# Patient Record
Sex: Female | Born: 1970 | Race: Black or African American | Hispanic: No | Marital: Married | Smoking: Never smoker
Health system: Southern US, Community
[De-identification: ages and names within clinical notes are randomized; demographics above are authoritative.]

## PROBLEM LIST (undated history)

## (undated) DIAGNOSIS — E785 Hyperlipidemia, unspecified: Secondary | ICD-10-CM

## (undated) DIAGNOSIS — I10 Essential (primary) hypertension: Secondary | ICD-10-CM

## (undated) DIAGNOSIS — E119 Type 2 diabetes mellitus without complications: Secondary | ICD-10-CM

## (undated) HISTORY — DX: Essential (primary) hypertension: I10

## (undated) HISTORY — DX: Type 2 diabetes mellitus without complications: E11.9

## (undated) HISTORY — DX: Hyperlipidemia, unspecified: E78.5

---

## 2006-07-15 DIAGNOSIS — E119 Type 2 diabetes mellitus without complications: Secondary | ICD-10-CM | POA: Insufficient documentation

## 2006-07-22 DIAGNOSIS — K2100 Gastro-esophageal reflux disease with esophagitis, without bleeding: Secondary | ICD-10-CM | POA: Insufficient documentation

## 2008-02-16 ENCOUNTER — Ambulatory Visit: Payer: Self-pay | Admitting: Family Medicine

## 2009-01-12 IMAGING — CT CT ABD-PELV W/O CM
1 of 2 series · 15 of 32 positions shown, 19 images · non-contrast
Comparison: none

REASON FOR EXAM: LLQ abdominal pain
                CALL REPORT
COMMENTS:   Patient is allergic to shellfish, throat swelling

[Series 2: abdomen · axial · 0.60mm/px · z∈[-565,-160]mm · 15 of 89 slices shown, 19 images]
[im 4/89  soft-tissue]
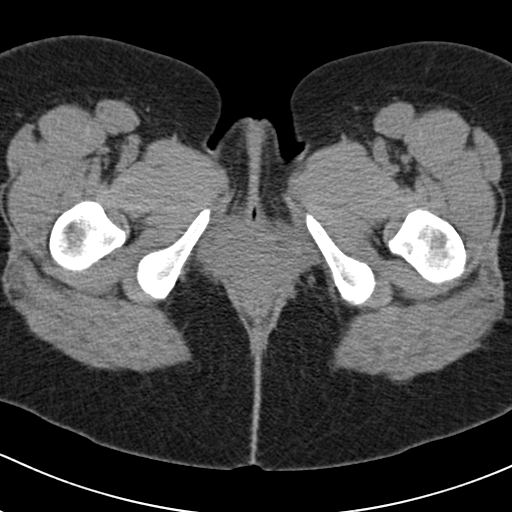
[im 4/89  bone]
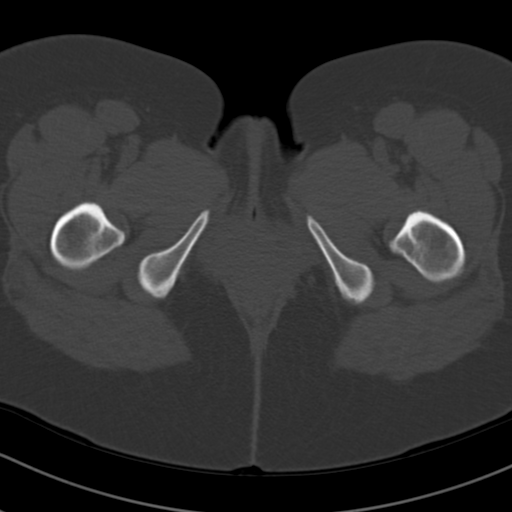
[im 12/89  soft-tissue]
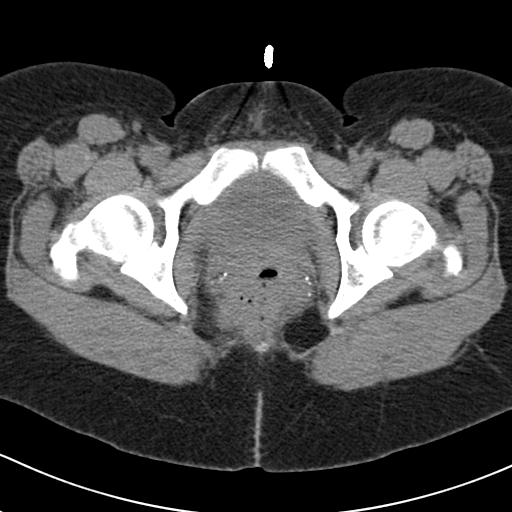
[im 19/89  soft-tissue]
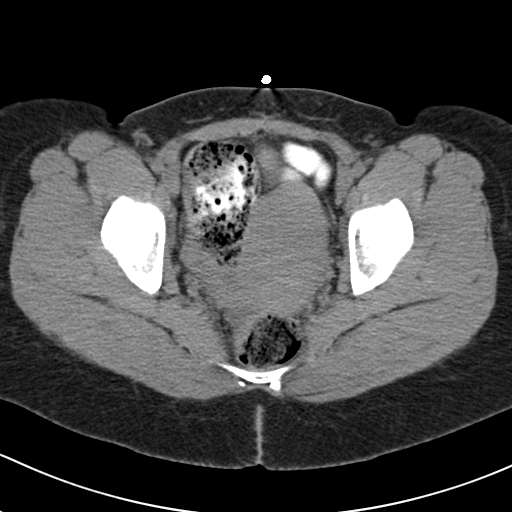
[im 26/89  soft-tissue]
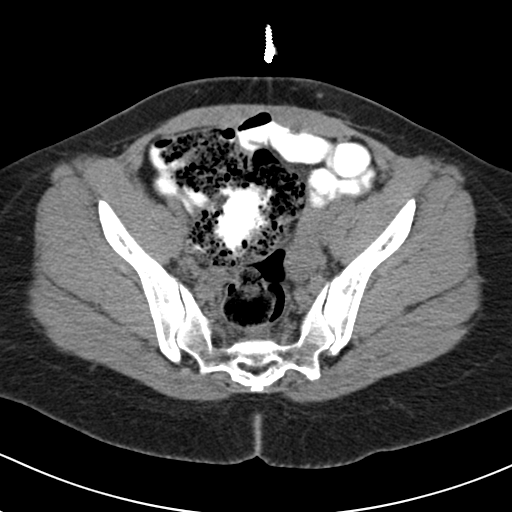
[im 30/89  soft-tissue]
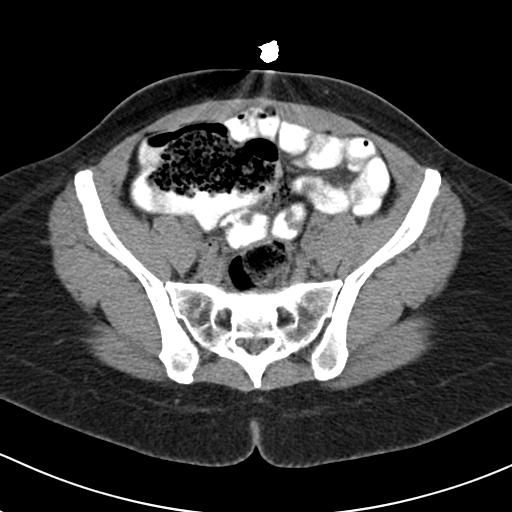
[im 37/89  soft-tissue]
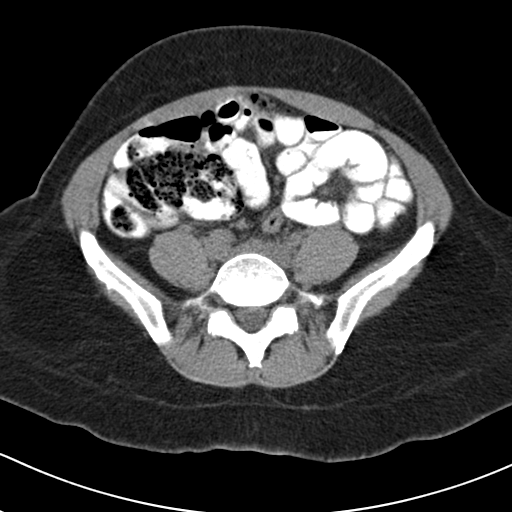
[im 45/89  soft-tissue]
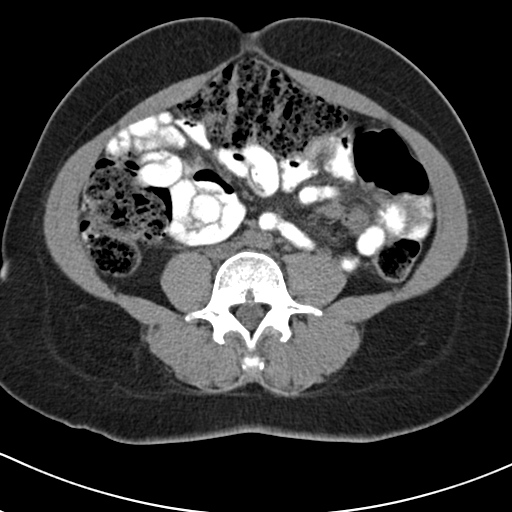
[im 52/89  soft-tissue]
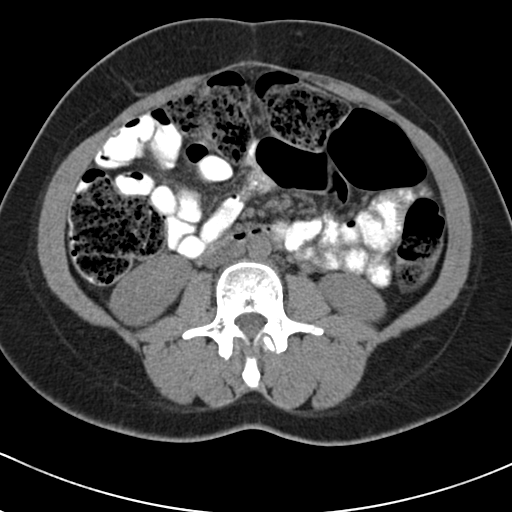
[im 59/89  soft-tissue]
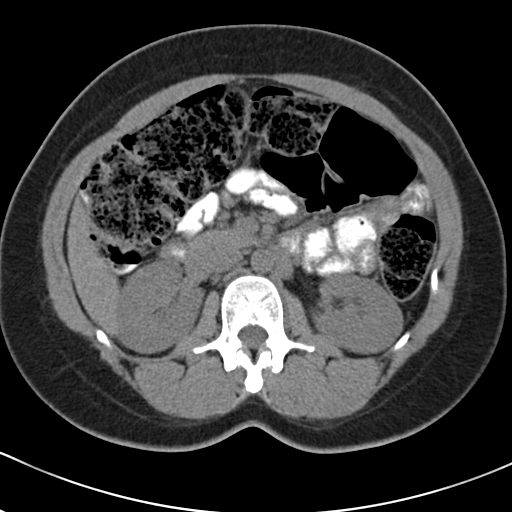
[im 59/89  bone]
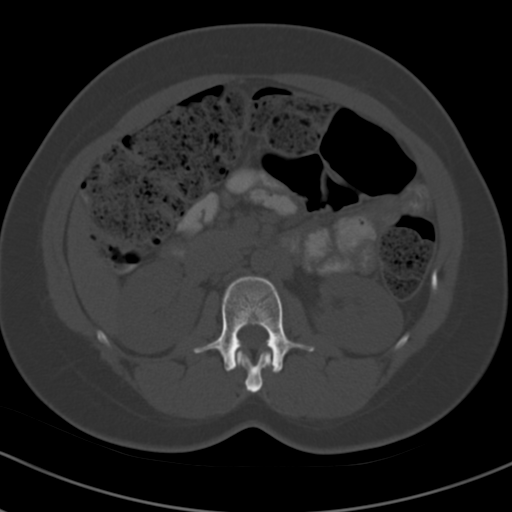
[im 63/89  soft-tissue]
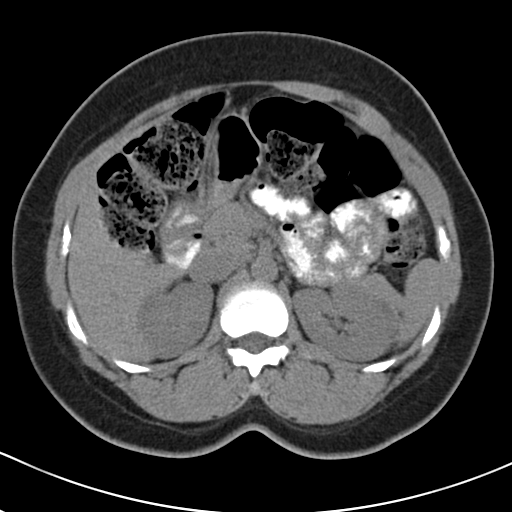
[im 70/89  soft-tissue]
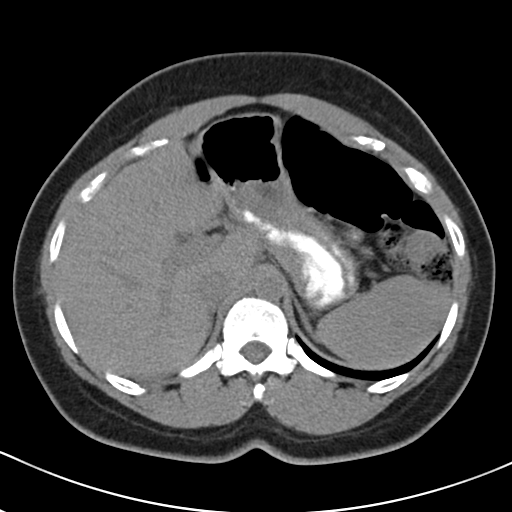
[im 74/89  lung]
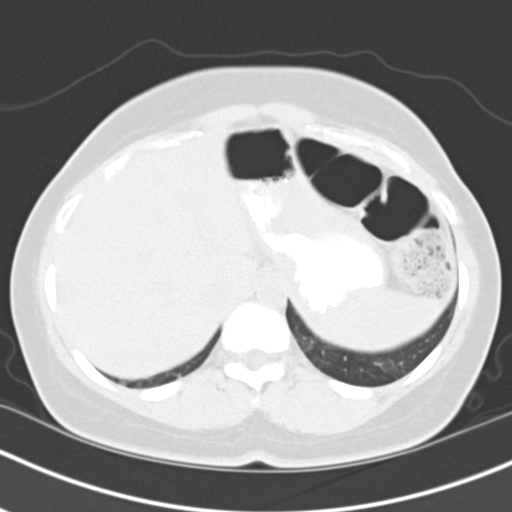
[im 78/89  soft-tissue]
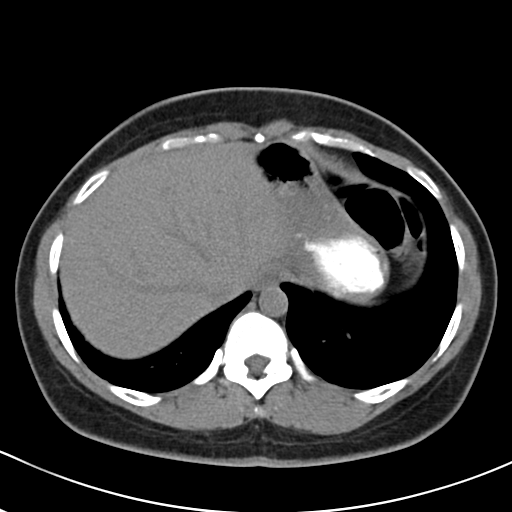
[im 78/89  lung]
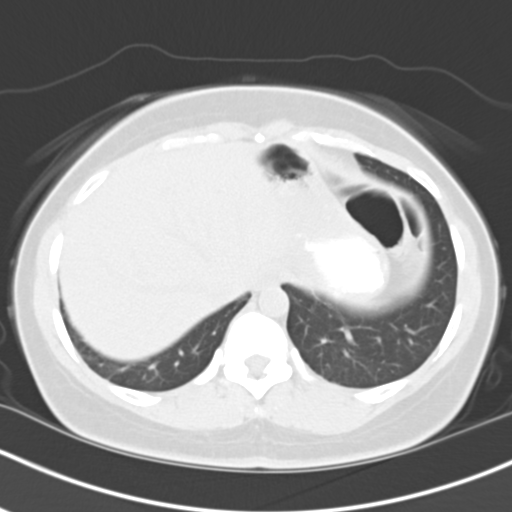
[im 81/89  lung]
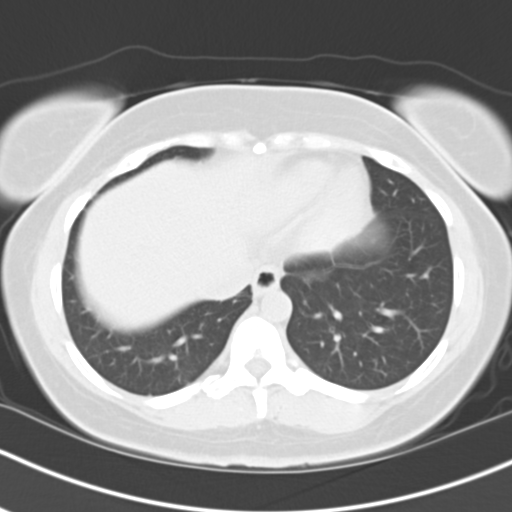
[im 85/89  soft-tissue]
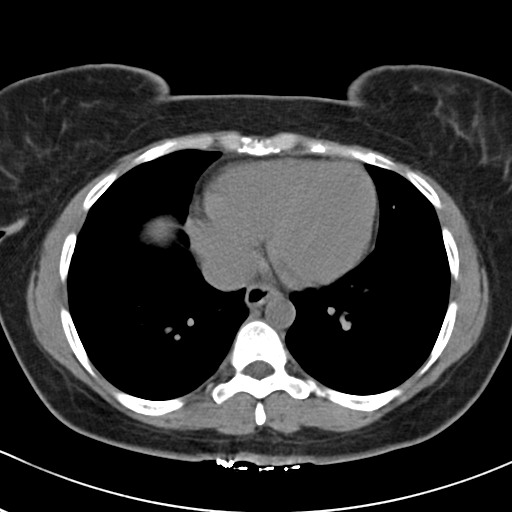
[im 85/89  lung]
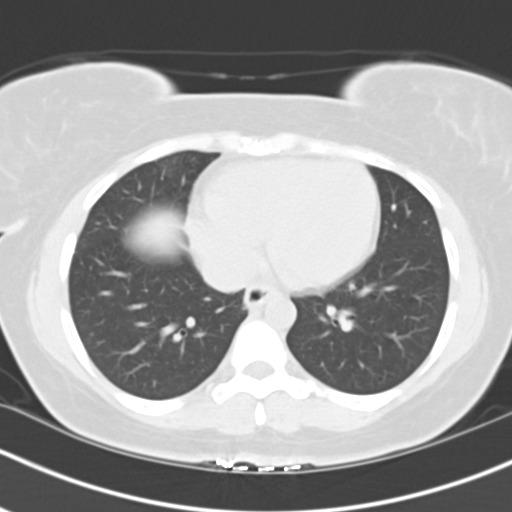

[15 of 32 positions shown; findings below may reference images not displayed]

PROCEDURE:     CT  - CT ABDOMEN AND PELVIS W[DATE]  [DATE]

RESULT:     Helical, non-IV contrast, 5.0 mm sections were obtained from the
lung bases through the pubic symphysis. The patient did receive oral
contrast.

Evaluation of the lung bases demonstrates no gross abnormalities.

Evaluation of the liver, spleen, adrenals, pancreas and kidneys are
unremarkable within the limitations of a noncontrast CT.

There is no CT evidence of bowel obstruction, free fluid, drainable
loculated fluid collections or gross evidence of masses. There does appear
to be small, subcentimeter lymph nodes within the mesentery as well as along
the periaortic region.
IMPRESSION: 1.     No evidence of bowel obstruction, masses, free fluid or drainable
loculated fluid collections. Small lymph nodes are identified within the
mesentery and along the periaortic region. Mesenteric adenitis cannot be
completely excluded.
2.     Moderate to large amount of stool within the ascending and transverse
colon.
3.     There is no evidence of pelvic free fluid or drainable loculated
fluid collections or masses. There does not appear to be CT evidence
reflecting the sequelae of diverticulitis, colitis or enteritis.

## 2010-12-15 ENCOUNTER — Ambulatory Visit: Payer: Self-pay | Admitting: Family Medicine

## 2010-12-26 ENCOUNTER — Ambulatory Visit: Payer: Self-pay | Admitting: Family Medicine

## 2014-04-12 ENCOUNTER — Ambulatory Visit: Payer: Self-pay | Admitting: Family Medicine

## 2014-06-03 LAB — HM PAP SMEAR: HM Pap smear: NEGATIVE

## 2014-07-14 LAB — HM HIV SCREENING LAB: HM HIV SCREENING: NEGATIVE

## 2014-08-12 DIAGNOSIS — O09529 Supervision of elderly multigravida, unspecified trimester: Secondary | ICD-10-CM | POA: Insufficient documentation

## 2014-08-12 DIAGNOSIS — Z8759 Personal history of other complications of pregnancy, childbirth and the puerperium: Secondary | ICD-10-CM | POA: Insufficient documentation

## 2015-03-09 IMAGING — US US PELV - US TRANSVAGINAL
1 series · 14 of 25 positions shown · non-contrast
Comparison: None

CLINICAL DATA: Dysfunctional uterine bleeding, LLQ discomfort



[Series 1: us pelv - us transvaginal · 0.28mm/px · 14 of 100 slices shown]
[im 1/100]
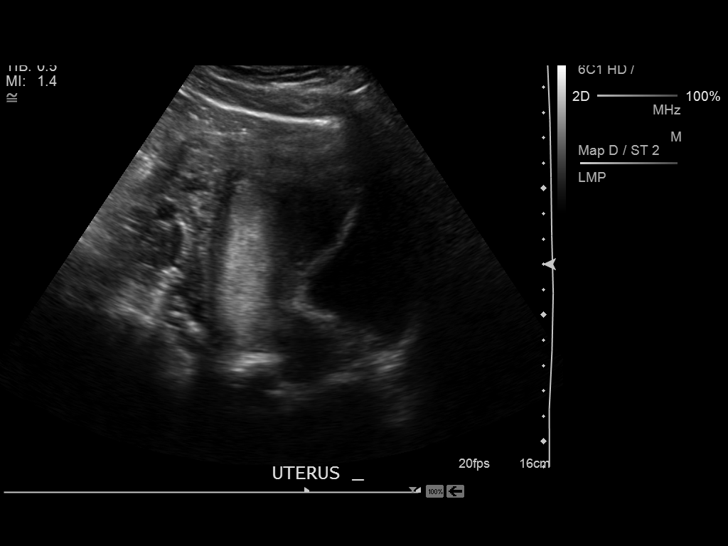
[im 9/100]
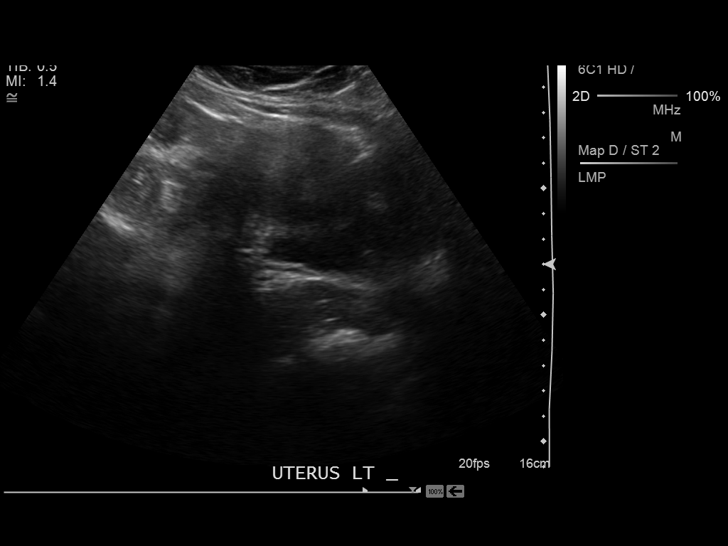
[im 17/100]
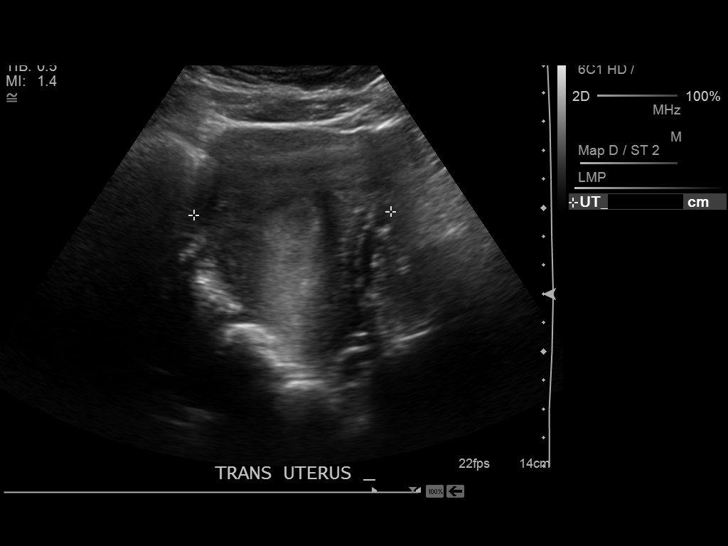
[im 25/100]
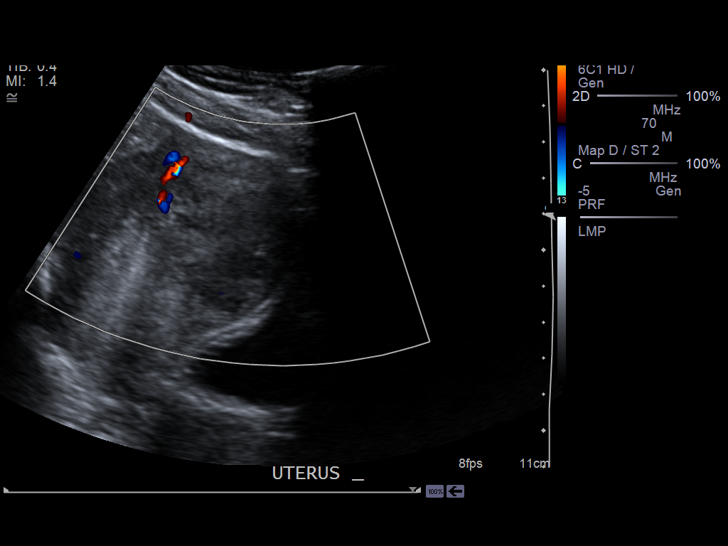
[im 34/100]
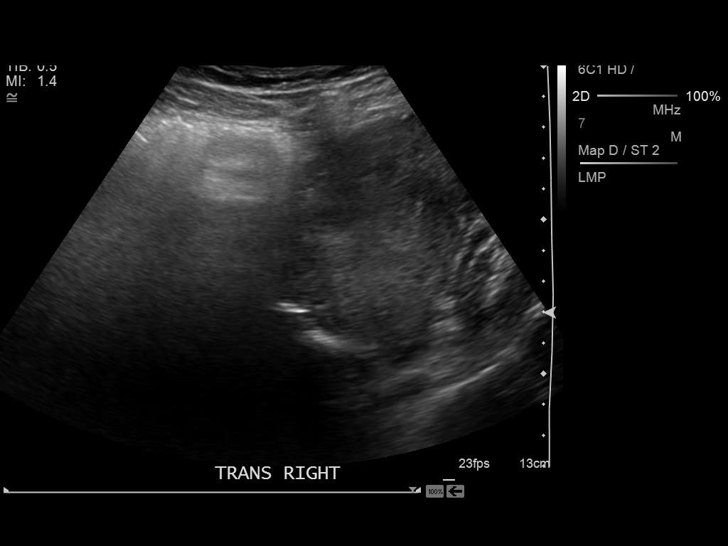
[im 38/100]
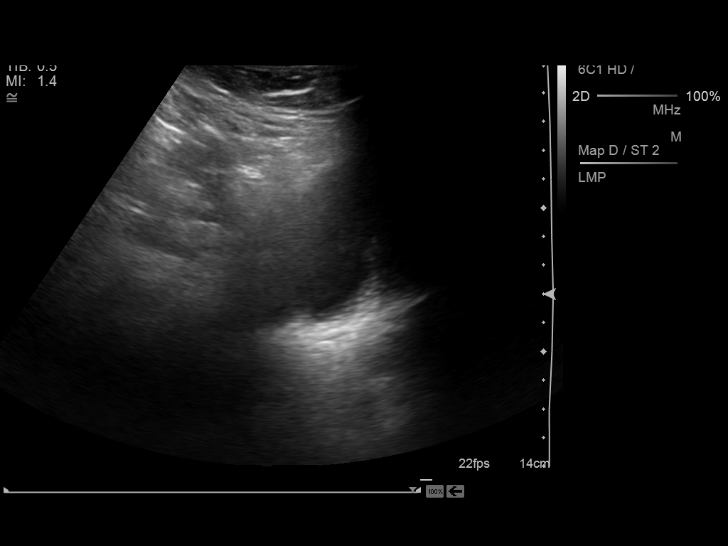
[im 46/100]
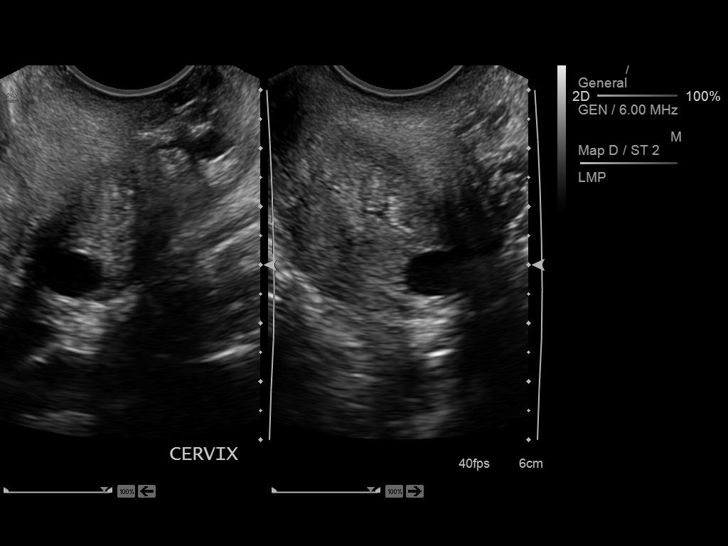
[im 54/100]
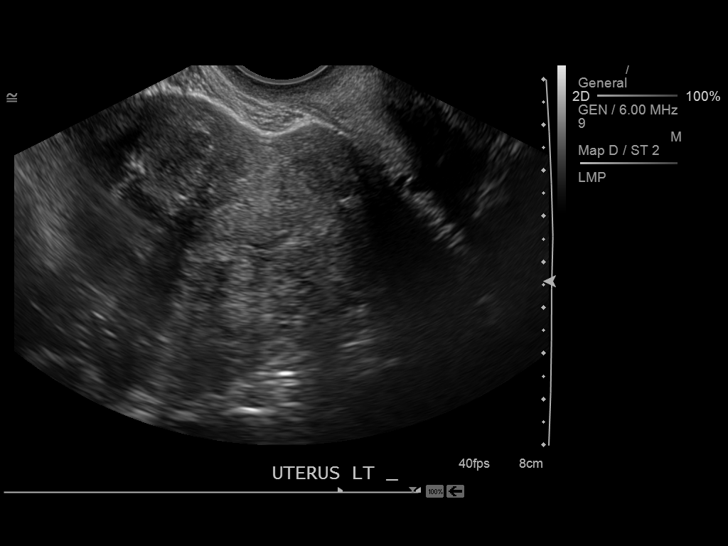
[im 62/100]
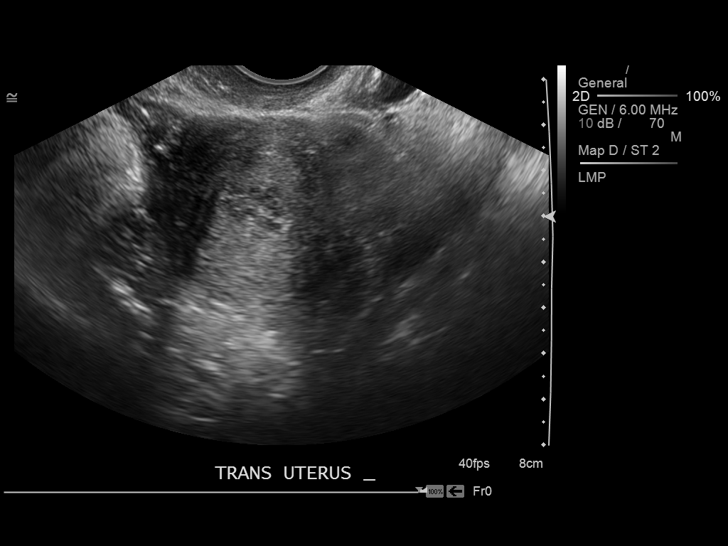
[im 67/100]
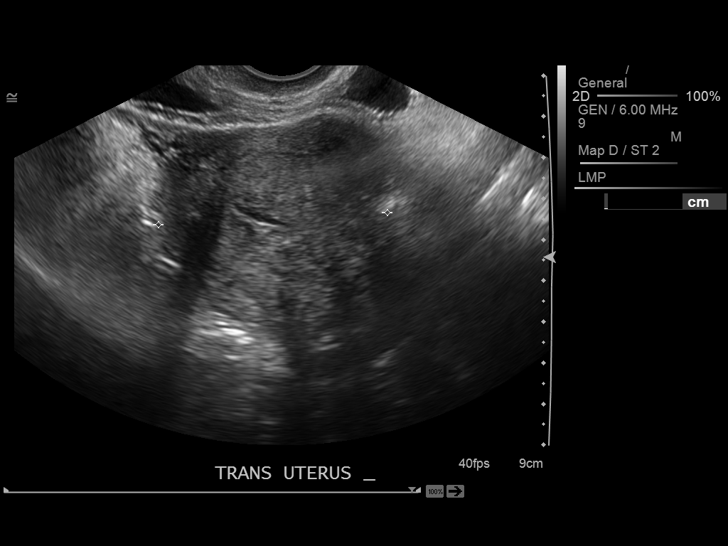
[im 75/100]
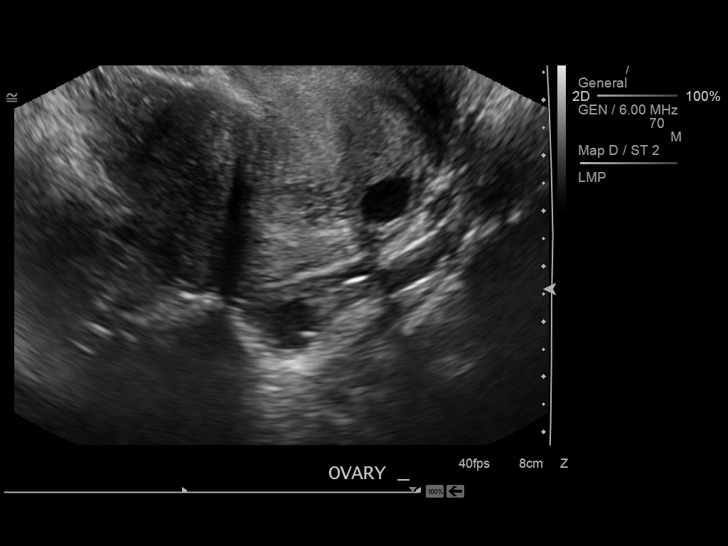
[im 83/100]
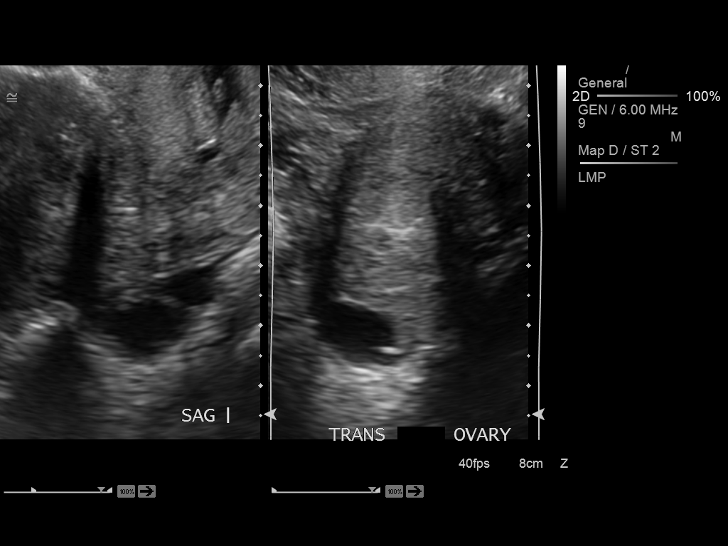
[im 91/100]
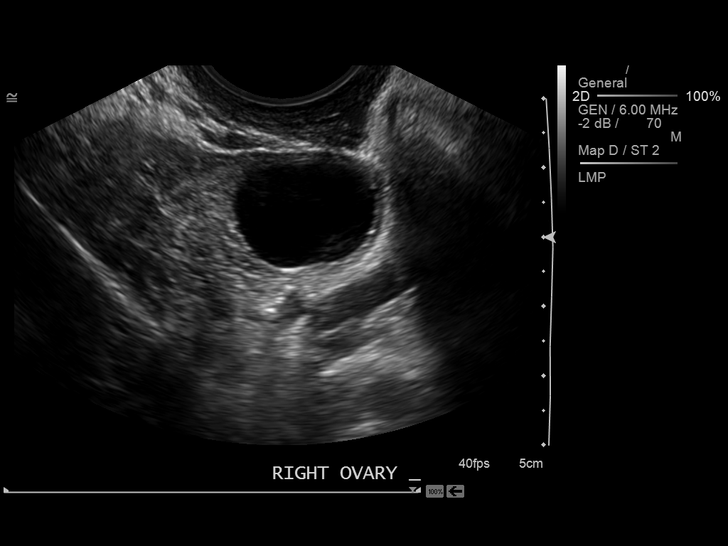
[im 100/100]
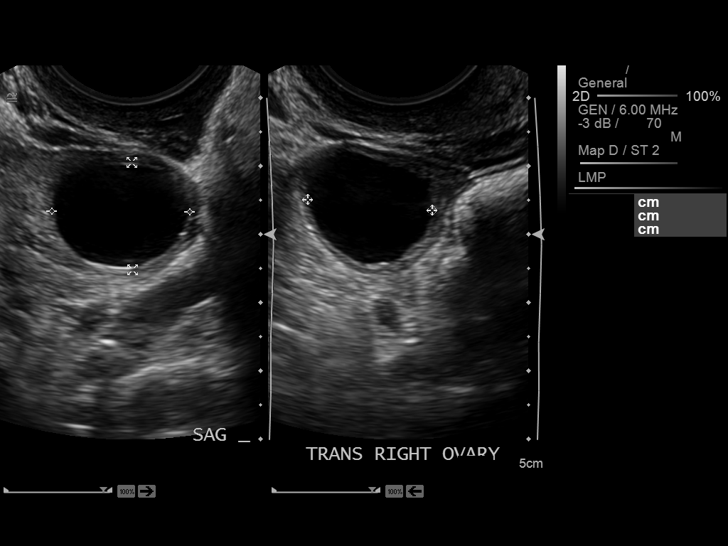

[14 of 25 positions shown; findings below may reference images not displayed]

FINDINGS: Uterus

Measurements: 8.9 x 7.5 x 6.9 cm. 4.5 x 4.5 x 4.8 cm
subserosal/pedunculated fibroid in the right anterior uterine
fundus. 2.7 x 2.5 x 3.0 cm pedunculated left posterior fundal
fibroid.

Endometrium

Thickness: 9 mm.  Trace fluid in the lower uterine segment.

Right ovary

Measurements: 3.4 x 2.0 x 2.7 cm. Dominant 2.0 cm corpus luteal
cyst.

Left ovary

Measurements: 2.5 x 1.0 x 2.0 cm.. Normal appearance/no adnexal
mass.

Other findings

No free fluid.
IMPRESSION: Two dominant uterine fibroids measuring up to 4.8 cm.

Endometrial complex measures 9 mm, within normal limits.

Dominant 2.0 cm right ovarian corpus luteal cyst.

## 2015-09-14 DIAGNOSIS — F4322 Adjustment disorder with anxiety: Secondary | ICD-10-CM | POA: Insufficient documentation

## 2015-09-14 DIAGNOSIS — I10 Essential (primary) hypertension: Secondary | ICD-10-CM | POA: Insufficient documentation

## 2015-09-14 DIAGNOSIS — N912 Amenorrhea, unspecified: Secondary | ICD-10-CM | POA: Insufficient documentation

## 2015-09-19 ENCOUNTER — Encounter: Payer: Self-pay | Admitting: Family Medicine

## 2015-09-19 ENCOUNTER — Ambulatory Visit (INDEPENDENT_AMBULATORY_CARE_PROVIDER_SITE_OTHER): Payer: BC Managed Care – PPO | Admitting: Family Medicine

## 2015-09-19 VITALS — BP 128/80 | HR 79 | Temp 98.3°F | Resp 16 | Wt 193.8 lb

## 2015-09-19 DIAGNOSIS — E785 Hyperlipidemia, unspecified: Secondary | ICD-10-CM

## 2015-09-19 DIAGNOSIS — E119 Type 2 diabetes mellitus without complications: Secondary | ICD-10-CM | POA: Diagnosis not present

## 2015-09-19 NOTE — Progress Notes (Signed)
Subjective:     Patient ID: Cheyenne Acosta, female   DOB: [birth year], 44 y.o.   MRN: [id number]  Diabetes She has type 2 diabetes mellitus. No MedicAlert identification noted. Her disease course has been stable. There are no hypoglycemic associated symptoms. Associated symptoms include visual change. Pertinent negatives for diabetes include no blurred vision, no chest pain, no fatigue, no foot paresthesias, no foot ulcerations, no polydipsia, no polyphagia, no polyuria, no weakness and no weight loss. There are no hypoglycemic complications. Symptoms are stable. There are no diabetic complications. Risk factors for coronary artery disease include diabetes mellitus. She is compliant with treatment all of the time. Her weight is fluctuating minimally. She is following a generally healthy diet. When asked about meal planning, she reported none. She has not had a previous visit with a dietitian. She never participates in exercise. There is no change in her home blood glucose trend. She does not see a podiatrist.Eye exam is not current (1 year ago, patient is due for eye exam ).   History reviewed. No pertinent past medical history. Patient Active Problem List   Diagnosis Date Noted  . Absence of menstruation 09/14/2015  . Essential (primary) hypertension 09/14/2015  . Adjustment disorder with anxiety 09/14/2015  . Advanced maternal age in multigravida 08/12/2014  . H/O previous obstetrical problem 08/12/2014  . Esophagitis, reflux 07/22/2006  . Diabetes (HCC) 07/15/2006  . HLD (hyperlipidemia) 07/15/2006   Past Surgical History  Procedure Laterality Date  . Cesarean section     Family History  Problem Relation Age of Onset  . Diabetes Mother   . Heart attack Mother   . Prostate cancer Father   . Hypertension Father   . Healthy Son    Allergies  Allergen Reactions  . Aspirin   . Latex   . Sulfa Antibiotics Rash   Current Outpatient Prescriptions on File Prior to Visit  Medication Sig  Dispense Refill  . metFORMIN (GLUCOPHAGE) 1000 MG tablet Take by mouth.     No current facility-administered medications on file prior to visit.   Social History  Substance Use Topics  . Smoking status: Never Smoker   . Smokeless tobacco: Never Used  . Alcohol Use: No    Review of Systems  Constitutional: Negative.  Negative for weight loss and fatigue.  HENT: Negative.   Eyes: Negative for blurred vision.  Respiratory: Negative.   Cardiovascular: Negative.  Negative for chest pain.  Endocrine: Negative.  Negative for polydipsia, polyphagia and polyuria.  Genitourinary: Negative.   Neurological: Negative for weakness.  Hematological: Negative.   Psychiatric/Behavioral: Negative.    Filed Vitals:   09/19/15 0812  BP: 128/80  Pulse: 79  Temp: 98.3 F (36.8 C)  Resp: 16  LMP one week ago.      Objective:   Physical Exam  Constitutional: She appears well-developed and well-nourished.  HENT:  Head: Normocephalic.  Right Ear: External ear normal.  Left Ear: External ear normal.  Nose: Nose normal.  Mouth/Throat: Oropharynx is clear and moist.  Eyes: Conjunctivae and EOM are normal.  Neck: Normal range of motion. Neck supple.  Cardiovascular: Normal rate, regular rhythm and normal heart sounds.   Pulmonary/Chest: Effort normal and breath sounds normal.  Abdominal: Soft. Bowel sounds are normal.  Musculoskeletal: Normal range of motion.  Neurological: She is alert. She has normal reflexes.  Normal sensation to test feet with nylon string.  Skin: No rash noted.  Psychiatric: She has a normal mood and affect. Her behavior is normal.  Assessment:     1. Type 2 diabetes mellitus without complication, without long-term current use of insulin (HCC) Stable with good tolerance and compliance with Metformin 1000 mg qd. Trying to follow diet and working on weight loss. Plans eye exam with ophthalmologist soon. Will get routine follow up labs and recheck pending  reports. - CBC with Differential/Platelet - COMPLETE METABOLIC PANEL WITH GFR - Hemoglobin A1c  2. HLD (hyperlipidemia) Not on any statins now. Trying to follow low fat diabetic diet and exercise more. Recheck lipids and TSH. - Lipid panel - TSH

## 2015-09-20 ENCOUNTER — Telehealth: Payer: Self-pay

## 2015-09-20 LAB — CBC WITH DIFFERENTIAL/PLATELET
BASOS ABS: 0 10*3/uL (ref 0.0–0.2)
Basos: 0 %
EOS (ABSOLUTE): 0.3 10*3/uL (ref 0.0–0.4)
EOS: 6 %
HEMATOCRIT: 34.3 % (ref 34.0–46.6)
HEMOGLOBIN: 10.7 g/dL — AB (ref 11.1–15.9)
IMMATURE GRANULOCYTES: 0 %
Immature Grans (Abs): 0 10*3/uL (ref 0.0–0.1)
Lymphocytes Absolute: 1.9 10*3/uL (ref 0.7–3.1)
Lymphs: 38 %
MCH: 25.8 pg — ABNORMAL LOW (ref 26.6–33.0)
MCHC: 31.2 g/dL — ABNORMAL LOW (ref 31.5–35.7)
MCV: 83 fL (ref 79–97)
MONOCYTES: 6 %
Monocytes Absolute: 0.3 10*3/uL (ref 0.1–0.9)
NEUTROS PCT: 50 %
Neutrophils Absolute: 2.6 10*3/uL (ref 1.4–7.0)
Platelets: 248 10*3/uL (ref 150–379)
RBC: 4.15 x10E6/uL (ref 3.77–5.28)
RDW: 14.8 % (ref 12.3–15.4)
WBC: 5.1 10*3/uL (ref 3.4–10.8)

## 2015-09-20 LAB — TSH: TSH: 0.993 u[IU]/mL (ref 0.450–4.500)

## 2015-09-20 LAB — COMPREHENSIVE METABOLIC PANEL
ALBUMIN: 4 g/dL (ref 3.5–5.5)
ALT: 10 IU/L (ref 0–32)
AST: 10 IU/L (ref 0–40)
Albumin/Globulin Ratio: 1.3 (ref 1.1–2.5)
Alkaline Phosphatase: 58 IU/L (ref 39–117)
BILIRUBIN TOTAL: 0.3 mg/dL (ref 0.0–1.2)
BUN / CREAT RATIO: 8 — AB (ref 9–23)
BUN: 6 mg/dL (ref 6–24)
CALCIUM: 8.9 mg/dL (ref 8.7–10.2)
CHLORIDE: 100 mmol/L (ref 96–106)
CO2: 24 mmol/L (ref 18–29)
CREATININE: 0.8 mg/dL (ref 0.57–1.00)
GFR, EST AFRICAN AMERICAN: 102 mL/min/{1.73_m2} (ref >59)
GFR, EST NON AFRICAN AMERICAN: 89 mL/min/{1.73_m2} (ref >59)
GLUCOSE: 165 mg/dL — AB (ref 65–99)
Globulin, Total: 3.2 g/dL (ref 1.5–4.5)
Potassium: 4.1 mmol/L (ref 3.5–5.2)
Sodium: 139 mmol/L (ref 134–144)
TOTAL PROTEIN: 7.2 g/dL (ref 6.0–8.5)

## 2015-09-20 LAB — LIPID PANEL
Chol/HDL Ratio: 3.3 ratio units (ref 0.0–4.4)
Cholesterol, Total: 233 mg/dL — ABNORMAL HIGH (ref 100–199)
HDL: 71 mg/dL (ref >39)
LDL Calculated: 151 mg/dL — ABNORMAL HIGH (ref 0–99)
Triglycerides: 53 mg/dL (ref 0–149)
VLDL Cholesterol Cal: 11 mg/dL (ref 5–40)

## 2015-09-20 LAB — HEMOGLOBIN A1C
ESTIMATED AVERAGE GLUCOSE: 194 mg/dL
HEMOGLOBIN A1C: 8.4 % — AB (ref 4.8–5.6)

## 2015-09-20 NOTE — Telephone Encounter (Signed)
LMTCB, Contacted [facility] to add on Iron and IBC, awaiting confirmation.

## 2015-09-20 NOTE — Telephone Encounter (Signed)
-----   Message from Tamsen Roersennis E Chrismon, [place] sent at 09/20/2015  1:19 PM EST ----- Hgb below normal with low MCH and MCHC. Ask lab to run total iron and IBC on this blood sample - suspect anemia. Hgb A1C, LDL cholesterol and glucose are high. Need to increase Metformin to 500 mg each evening and 1000 mg each morning. Need Simvastatin 20 mg qd #30 & 3 RF.

## 2015-09-23 LAB — IRON AND TIBC
Iron Saturation: 12 % — ABNORMAL LOW (ref 15–55)
Iron: 38 ug/dL (ref 27–159)
Total Iron Binding Capacity: 325 ug/dL (ref 250–450)
UIBC: 287 ug/dL (ref 131–425)

## 2015-09-23 LAB — SPECIMEN STATUS REPORT

## 2015-09-29 MED ORDER — METFORMIN HCL 1000 MG PO TABS: ORAL_TABLET | ORAL | Status: DC

## 2015-09-29 NOTE — Telephone Encounter (Signed)
Patient advised as directed below. Patient verbalized understanding. RX for [facility] sent to [facility] to reflect change in dosage.   Patient states she is unable to take statin medication due to muscle aches. Patient states she will restart fish oil instead of starting Simvastatin.

## 2015-09-29 NOTE — Telephone Encounter (Signed)
LMTCB

## 2015-09-29 NOTE — Telephone Encounter (Signed)
-----   Message from Tamsen Roersennis E Chrismon, [place] sent at 09/26/2015  1:47 PM EST ----- Low iron saturation and low iron level. Recommend Slow-Fe one daily and recheck levels in a month.

## 2016-03-26 ENCOUNTER — Telehealth: Payer: Self-pay | Admitting: Family Medicine

## 2016-03-26 MED ORDER — DIAZEPAM 5 MG PO TABS: 5.0000 mg | ORAL_TABLET | Freq: Two times a day (BID) | ORAL | Status: DC | PRN

## 2016-03-26 NOTE — Telephone Encounter (Signed)
Phone in refill (order in chart meds list) of Diazepam 5 mg one at bedtime as needed for muscle spasm #30. Remind patient to keep appointment on 04-16-16.

## 2016-03-26 NOTE — Telephone Encounter (Signed)
Pt is requesting a refill of diazapam 5 mg, [facility] [place].  Pt scheduled appt for 7/17

## 2016-03-26 NOTE — Telephone Encounter (Signed)
Pt called to see if RX for diazepam (VALIUM) 5 MG tablet was sent to [facility] [place]. I advised that refill request can take 24 to 48 hours. Pt request that it please be sent in today. Pt is scheduled for OV on 04/16/16. Last OV: 09/19/15 Last written: 04/30/14 Please advise. Thanks TNP

## 2016-03-27 NOTE — Telephone Encounter (Signed)
Called in Rx as stated below.

## 2016-04-16 ENCOUNTER — Ambulatory Visit (INDEPENDENT_AMBULATORY_CARE_PROVIDER_SITE_OTHER): Payer: BC Managed Care – PPO | Admitting: Family Medicine

## 2016-04-16 ENCOUNTER — Encounter: Payer: Self-pay | Admitting: Family Medicine

## 2016-04-16 VITALS — BP 120/72 | HR 80 | Temp 98.5°F | Resp 16 | Wt 183.0 lb

## 2016-04-16 DIAGNOSIS — E785 Hyperlipidemia, unspecified: Secondary | ICD-10-CM | POA: Diagnosis not present

## 2016-04-16 DIAGNOSIS — R42 Dizziness and giddiness: Secondary | ICD-10-CM

## 2016-04-16 DIAGNOSIS — E119 Type 2 diabetes mellitus without complications: Secondary | ICD-10-CM | POA: Diagnosis not present

## 2016-04-16 DIAGNOSIS — Z8742 Personal history of other diseases of the female genital tract: Secondary | ICD-10-CM

## 2016-04-16 DIAGNOSIS — D509 Iron deficiency anemia, unspecified: Secondary | ICD-10-CM | POA: Diagnosis not present

## 2016-04-16 DIAGNOSIS — Z8759 Personal history of other complications of pregnancy, childbirth and the puerperium: Secondary | ICD-10-CM

## 2016-04-16 NOTE — Progress Notes (Signed)
Patient: Cheyenne Acosta Female    DOB: 09/10/1971   45 y.o.   MRN: [id number] Visit Date: 04/16/2016  Today's Provider: Dortha Kern, PA   Chief Complaint  Patient presents with  . Diabetes  . Hyperlipidemia  . Dizziness    1 week    Subjective:    HPI  Diabetes Mellitus Type II, Follow-up:   Lab Results  Component Value Date   HGBA1C 8.4* 09/19/2015    Last seen for diabetes 6  months ago.  Management since then includes no changes. She reports good compliance with treatment. She is not having side effects.  Current symptoms include none and have been stable. Home blood sugar records: are not being checked  Episodes of hypoglycemia? no   Current Insulin Regimen: none Most Recent Eye Exam: last year Weight trend: stable Prior visit with dietician: no Current diet: well balanced Current exercise: none  Pertinent Labs:    Component Value Date/Time   CHOL 233* 09/19/2015 0901   TRIG 53 09/19/2015 0901   HDL 71 09/19/2015 0901   LDLCALC 151* 09/19/2015 0901   CREATININE 0.80 09/19/2015 0901    Wt Readings from Last 3 Encounters:  04/16/16 183 lb (83.008 kg)  09/19/15 193 lb 12.8 oz (87.907 kg)    ------------------------------------------------------------------------  Lipid/Cholesterol, Follow-up:   Last seen for this6 months ago.  Management changes since that visit include starting Simvastatin . Patient reports that she was unable to start Simvastatin because of the muscle aches.  . Last Lipid Panel:    Component Value Date/Time   CHOL 233* 09/19/2015 0901   TRIG 53 09/19/2015 0901   HDL 71 09/19/2015 0901   CHOLHDL 3.3 09/19/2015 0901   LDLCALC 151* 09/19/2015 0901    Risk factors for vascular disease include diabetes mellitus  She reports fair compliance with treatment. She is not having side effects.  Weight trend: stable Prior visit with dietician: no Current diet: well balanced Current exercise: none  Wt Readings  from Last 3 Encounters:  04/16/16 183 lb (83.008 kg)  09/19/15 193 lb 12.8 oz (87.907 kg)    -------------------------------------------------------------------    Patient also reports that she has had dizziness off and on X 1 week. Patient also mentions that she has had a associated headache as well. Patient thinks that she may be going through menopause. Has menses once a month and it lasts 3-4 days. No clots or unusual flow. Has a history of anemia and takes iron supplement daily.   No past medical history on file. Patient Active Problem List   Diagnosis Date Noted  . Absence of menstruation 09/14/2015  . Essential (primary) hypertension 09/14/2015  . Adjustment disorder with anxiety 09/14/2015  . Advanced maternal age in multigravida 08/12/2014  . H/O previous obstetrical problem 08/12/2014  . Esophagitis, reflux 07/22/2006  . Diabetes (HCC) 07/15/2006  . HLD (hyperlipidemia) 07/15/2006   Past Surgical History  Procedure Laterality Date  . Cesarean section     Family History  Problem Relation Age of Onset  . Diabetes Mother   . Heart attack Mother   . Prostate cancer Father   . Hypertension Father   . Healthy Son     Allergies  Allergen Reactions  . Aspirin   . Latex   . Sulfa Antibiotics Rash   Current Meds  Medication Sig  . diazepam (VALIUM) 5 MG tablet Take 1 tablet (5 mg total) by mouth every 12 (twelve) hours as needed for anxiety.  Marland Kitchen  metFORMIN (GLUCOPHAGE) 1000 MG tablet Take 500 mg (1/2 tablet) each evening and take 1000 mg each morning    Review of Systems  Eyes: Negative.   Respiratory: Negative.   Cardiovascular: Negative.   Gastrointestinal: Negative.   Genitourinary: Negative.   Musculoskeletal: Positive for neck stiffness.       History of neck injury lifting a heavy patient in 2004. Not association with headache or dizziness.  Neurological: Positive for dizziness.       Spinning sensation when she first gets up in the morning or first  standing after sitting a while. Slight frontal headache and occasional nausea without vomiting. Rest for an hour to get spinning to clear to a lightheaded sensation.  Psychiatric/Behavioral: Negative.     Social History  Substance Use Topics  . Smoking status: Never Smoker   . Smokeless tobacco: Never Used  . Alcohol Use: No   Objective:   BP 120/72 mmHg  Pulse 80  Temp(Src) 98.5 F (36.9 C)  Resp 16  Wt 183 lb (83.008 kg)  LMP 04/13/2016 (Approximate)  Wt Readings from Last 3 Encounters:  04/16/16 183 lb (83.008 kg)  09/19/15 193 lb 12.8 oz (87.907 kg)    Physical Exam  Constitutional: She is oriented to person, place, and time. She appears well-developed and well-nourished.  HENT:  Head: Normocephalic.  Right Ear: External ear normal.  Left Ear: External ear normal.  Mouth/Throat: Oropharynx is clear and moist.  Pale membranes.  Eyes: Conjunctivae and EOM are normal.  Neck: Normal range of motion. Neck supple.  Cardiovascular: Normal rate, regular rhythm, normal heart sounds and intact distal pulses.   No murmur heard. Pulmonary/Chest: Effort normal and breath sounds normal.  Abdominal: Soft. Bowel sounds are normal.  Musculoskeletal: Normal range of motion.  Neurological: She is alert and oriented to person, place, and time. She has normal reflexes.  Skin: No rash noted.  Psychiatric: She has a normal mood and affect. Her behavior is normal.      Assessment & Plan:     1. Vertigo Onset over the past week with some headache occasionally. Worse when she first stands for a few seconds. No fever, earache, hearing loss, vomiting, hematuria, hematochezia, diarrhea, cough, congestion, dyspnea, dyspepsia, prolonged menses or vision changes. Will check labs for metabolic or hematologic disorder versus viral illness. May use Diazepam prn vertigo and home to rest for 5 days. Follow up pending lab reports. - CBC with Differential/Platelet - Comprehensive metabolic panel -  Iron - Iron and TIBC  2. Iron deficiency anemia Continues to take an OTC iron supplement. Will check labs for progress. Mucus membranes seem a bit pale today. Denies any evidence of blood loss. Has menses once a month and lasts only 3-4 days. - CBC with Differential/Platelet - Iron - Iron and TIBC  3. Type 2 diabetes mellitus without complication, without long-term current use of insulin (HCC) Has only been taking Metformin 500 mg BID. Not checking FBS at home. Has lost 10 lbs since Dec. 2016. Will recheck labs and follow up pending reports. - Comprehensive metabolic panel - Hemoglobin A1c  4. HLD (hyperlipidemia) Only using low fat diet and Fish Oil supplement. Did not want to start the Simvastatin in Dec. 2016. Will recheck lipids and TSH. - Lipid panel - TSH  5. H/O previous obstetrical problem Had miscarriage in Dec. 2015. Normal and regular menses since that time. No hormones. No night sweats or hot flashes.       Dortha Kernennis Chrismon, PA  [facility]

## 2016-04-17 ENCOUNTER — Telehealth: Payer: Self-pay

## 2016-04-17 LAB — COMPREHENSIVE METABOLIC PANEL WITH GFR
ALT: 12 [IU]/L (ref 0–32)
AST: 11 [IU]/L (ref 0–40)
Albumin/Globulin Ratio: 1.3 (ref 1.2–2.2)
Albumin: 4.2 g/dL (ref 3.5–5.5)
Alkaline Phosphatase: 54 [IU]/L (ref 39–117)
BUN/Creatinine Ratio: 12 (ref 9–23)
BUN: 9 mg/dL (ref 6–24)
Bilirubin Total: 0.3 mg/dL (ref 0.0–1.2)
CO2: 24 mmol/L (ref 18–29)
Calcium: 9 mg/dL (ref 8.7–10.2)
Chloride: 101 mmol/L (ref 96–106)
Creatinine, Ser: 0.74 mg/dL (ref 0.57–1.00)
GFR calc Af Amer: 112 mL/min/{1.73_m2}
GFR calc non Af Amer: 97 mL/min/{1.73_m2}
Globulin, Total: 3.2 g/dL (ref 1.5–4.5)
Glucose: 183 mg/dL — ABNORMAL HIGH (ref 65–99)
Potassium: 4.8 mmol/L (ref 3.5–5.2)
Sodium: 139 mmol/L (ref 134–144)
Total Protein: 7.4 g/dL (ref 6.0–8.5)

## 2016-04-17 LAB — CBC WITH DIFFERENTIAL/PLATELET
Basophils Absolute: 0 10*3/uL (ref 0.0–0.2)
Basos: 0 %
EOS (ABSOLUTE): 0.2 10*3/uL (ref 0.0–0.4)
Eos: 5 %
Hematocrit: 37.8 % (ref 34.0–46.6)
Hemoglobin: 12.1 g/dL (ref 11.1–15.9)
Immature Grans (Abs): 0 10*3/uL (ref 0.0–0.1)
Immature Granulocytes: 0 %
Lymphocytes Absolute: 1.9 10*3/uL (ref 0.7–3.1)
Lymphs: 45 %
MCH: 27.4 pg (ref 26.6–33.0)
MCHC: 32 g/dL (ref 31.5–35.7)
MCV: 86 fL (ref 79–97)
Monocytes Absolute: 0.3 10*3/uL (ref 0.1–0.9)
Monocytes: 6 %
Neutrophils Absolute: 1.8 10*3/uL (ref 1.4–7.0)
Neutrophils: 44 %
Platelets: 221 10*3/uL (ref 150–379)
RBC: 4.42 x10E6/uL (ref 3.77–5.28)
RDW: 14.5 % (ref 12.3–15.4)
WBC: 4.2 10*3/uL (ref 3.4–10.8)

## 2016-04-17 LAB — IRON AND TIBC
IRON SATURATION: 35 % (ref 15–55)
IRON: 95 ug/dL (ref 27–159)
Total Iron Binding Capacity: 274 ug/dL (ref 250–450)
UIBC: 179 ug/dL (ref 131–425)

## 2016-04-17 LAB — LIPID PANEL
Chol/HDL Ratio: 3.8 ratio units (ref 0.0–4.4)
Cholesterol, Total: 266 mg/dL — ABNORMAL HIGH (ref 100–199)
HDL: 70 mg/dL (ref >39)
LDL Calculated: 186 mg/dL — ABNORMAL HIGH (ref 0–99)
Triglycerides: 48 mg/dL (ref 0–149)
VLDL Cholesterol Cal: 10 mg/dL (ref 5–40)

## 2016-04-17 LAB — TSH: TSH: 0.698 u[IU]/mL (ref 0.450–4.500)

## 2016-04-17 LAB — HEMOGLOBIN A1C
Est. average glucose Bld gHb Est-mCnc: 200 mg/dL
HEMOGLOBIN A1C: 8.6 % — AB (ref 4.8–5.6)

## 2016-04-17 NOTE — Telephone Encounter (Signed)
Tried calling patient and no answer. Will try again later.  

## 2016-04-17 NOTE — Telephone Encounter (Signed)
-----   Message from Tamsen Roersennis E Chrismon, [place] sent at 04/17/2016  8:56 AM EDT ----- No sign of anemia. Normal blood counts and iron levels now. Diabetes worsening with glucose higher and hgb A1C higher. Cholesterol total and LDL higher, also. Definitely need to either increase Metformin to 500 mg AM and 1000 mg PM. If she prefers, can make a referral to an endocrinologist to get sugar under control. Uncontrolled diabetes can contribute to dizziness. May try Meclizine (OTC Bonine) for dizziness. Should get ENT or neurology referral if vertigo persists. Recheck diabetes control in 6-8 weeks and keep a record of fasting blood sugar daily at home.

## 2016-04-23 NOTE — Telephone Encounter (Signed)
Call patient to give lab results.

## 2016-04-26 ENCOUNTER — Encounter: Payer: Self-pay | Admitting: Family Medicine

## 2016-04-26 ENCOUNTER — Ambulatory Visit (INDEPENDENT_AMBULATORY_CARE_PROVIDER_SITE_OTHER): Payer: BC Managed Care – PPO | Admitting: Family Medicine

## 2016-04-26 VITALS — BP 104/70 | HR 83 | Temp 98.2°F | Resp 16 | Wt 188.0 lb

## 2016-04-26 DIAGNOSIS — N3000 Acute cystitis without hematuria: Secondary | ICD-10-CM | POA: Diagnosis not present

## 2016-04-26 DIAGNOSIS — R300 Dysuria: Secondary | ICD-10-CM | POA: Diagnosis not present

## 2016-04-26 LAB — POCT URINALYSIS DIPSTICK
BILIRUBIN UA: NEGATIVE
GLUCOSE UA: NEGATIVE
Nitrite, UA: POSITIVE
SPEC GRAV UA: 1.01
Urobilinogen, UA: 1
pH, UA: 6.5

## 2016-04-26 MED ORDER — CIPROFLOXACIN HCL 500 MG PO TABS: 500.0000 mg | ORAL_TABLET | Freq: Two times a day (BID) | ORAL | 0 refills | Status: DC

## 2016-04-26 MED ORDER — PHENAZOPYRIDINE HCL 200 MG PO TABS: 200.0000 mg | ORAL_TABLET | Freq: Three times a day (TID) | ORAL | 0 refills | Status: DC | PRN

## 2016-04-26 NOTE — Progress Notes (Signed)
Patient: Cheyenne Acosta Female    DOB: 1971/07/07   45 y.o.   MRN: [id number] Visit Date: 04/26/2016  Today's Provider: Dortha Kern, PA   Chief Complaint  Patient presents with  . Dysuria  . Urinary Frequency   Subjective:    Dysuria   This is a new problem. The current episode started in the past 7 days (1 week). The problem occurs every urination. The problem has been unchanged. The quality of the pain is described as burning. The pain is at a severity of 4/10. The pain is moderate. There has been no fever. Associated symptoms include frequency, hematuria and urgency. She has tried NSAIDs (ibuprofen) for the symptoms. The treatment provided mild relief.   History reviewed. No pertinent past medical history. Patient Active Problem List   Diagnosis Date Noted  . Absence of menstruation 09/14/2015  . Essential (primary) hypertension 09/14/2015  . Adjustment disorder with anxiety 09/14/2015  . Advanced maternal age in multigravida 08/12/2014  . H/O previous obstetrical problem 08/12/2014  . Esophagitis, reflux 07/22/2006  . Diabetes (HCC) 07/15/2006  . HLD (hyperlipidemia) 07/15/2006   Past Surgical History:  Procedure Laterality Date  . CESAREAN SECTION     Family History  Problem Relation Age of Onset  . Diabetes Mother   . Heart attack Mother   . Prostate cancer Father   . Hypertension Father   . Healthy Son    Allergies  Allergen Reactions  . Aspirin   . Latex   . Sulfa Antibiotics Rash   Current Meds  Medication Sig  . diazepam (VALIUM) 5 MG tablet Take 1 tablet (5 mg total) by mouth every 12 (twelve) hours as needed for anxiety.  . metFORMIN (GLUCOPHAGE) 1000 MG tablet Take 500 mg (1/2 tablet) each evening and take 1000 mg each morning    Review of Systems  Constitutional: Negative for appetite change, fatigue and fever.  Respiratory: Negative for chest tightness and shortness of breath.   Cardiovascular: Negative for chest pain and  palpitations.  Gastrointestinal: Negative for abdominal pain.  Genitourinary: Positive for dysuria, frequency, hematuria and urgency.  Neurological: Negative for dizziness and weakness.    Social History  Substance Use Topics  . Smoking status: Never Smoker  . Smokeless tobacco: Never Used  . Alcohol use No   Objective:   BP 104/70 (BP Location: Left Arm, Patient Position: Sitting, Cuff Size: Large)   Pulse 83   Temp 98.2 F (36.8 C) (Oral)   Resp 16   Wt 188 lb (85.3 kg)   LMP 04/13/2016 (Approximate)   SpO2 98%   Physical Exam  Constitutional: She is oriented to person, place, and time. She appears well-developed and well-nourished. No distress.  HENT:  Head: Normocephalic and atraumatic.  Right Ear: Hearing normal.  Left Ear: Hearing normal.  Nose: Nose normal.  Eyes: Conjunctivae and lids are normal. Right eye exhibits no discharge. Left eye exhibits no discharge. No scleral icterus.  Cardiovascular: Normal rate and regular rhythm.   Pulmonary/Chest: Effort normal. No respiratory distress.  Abdominal: Soft. Bowel sounds are normal. She exhibits no mass. There is tenderness. There is no guarding.  Suprapubic tenderness to palpation. No CVA tenderness to percussion posteriorly.  Musculoskeletal: Normal range of motion.  Neurological: She is alert and oriented to person, place, and time.  Skin: Skin is intact. No lesion and no rash noted.  Psychiatric: She has a normal mood and affect. Her speech is normal and behavior is normal. Thought  content normal.      Assessment & Plan:      1. Dysuria Onset over the past week with worsening the past 24 hours. No gross blood in urine. Urinalysis shows pyuria and positive nitrites with large amount of blood. Will give Pyridium for symptoms and encouraged to drink extra fluids. - POCT urinalysis dipstick - Urine culture - phenazopyridine (PYRIDIUM) 200 MG tablet; Take 1 tablet (200 mg total) by mouth 3 (three) times daily as needed  for pain.  Dispense: 10 tablet; Refill: 0  2. Acute cystitis without hematuria Microscopic exam of urine confirms pyuria with large amount of blood on dipstick. No fever. Will get C&S and start Cipro. Recheck pending culture report. - ciprofloxacin (CIPRO) 500 MG tablet; Take 1 tablet (500 mg total) by mouth 2 (two) times daily.  Dispense: 14 tablet; Refill: 0 - phenazopyridine (PYRIDIUM) 200 MG tablet; Take 1 tablet (200 mg total) by mouth 3 (three) times daily as needed for pain.  Dispense: 10 tablet; Refill: 0       Dortha Kern, PA  [facility]

## 2016-04-26 NOTE — Patient Instructions (Signed)

## 2016-04-28 LAB — URINE CULTURE

## 2016-05-10 ENCOUNTER — Other Ambulatory Visit: Payer: Self-pay | Admitting: Family Medicine

## 2016-05-10 NOTE — Telephone Encounter (Signed)
Last FU OV 04/16/2016. Allene DillonEmily Drozdowski, CMA

## 2016-06-14 ENCOUNTER — Other Ambulatory Visit: Payer: Self-pay | Admitting: Family Medicine

## 2016-06-14 ENCOUNTER — Telehealth: Payer: Self-pay | Admitting: Family Medicine

## 2016-06-14 DIAGNOSIS — Z021 Encounter for pre-employment examination: Secondary | ICD-10-CM

## 2016-06-14 NOTE — Telephone Encounter (Signed)
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  She did not want to come in.  She just wants Maurine MinisterDennis to do an order for [facility] to [facility]  Her call back is   [phone]  Thanks, Barth Kirkseri

## 2016-06-14 NOTE — Telephone Encounter (Signed)
Patient advised.

## 2016-06-14 NOTE — Telephone Encounter (Signed)
Placed order for patient to come pick up requisition.

## 2016-06-16 LAB — DRUG SCREEN, UR (12+OXYCODONE+CRT)
AMPHETAMINE SCREEN URINE: NEGATIVE ng/mL
BARBITURATE SCREEN URINE: NEGATIVE ng/mL
BENZODIAZEPINE SCREEN, URINE: NEGATIVE ng/mL
CANNABINOIDS UR QL SCN: NEGATIVE ng/mL
Cocaine (Metab) Scrn, Ur: NEGATIVE ng/mL
Creatinine(Crt), U: 164.7 mg/dL (ref 20.0–300.0)
Fentanyl, Urine: NEGATIVE pg/mL
METHADONE SCREEN, URINE: NEGATIVE ng/mL
Meperidine Screen, Urine: NEGATIVE ng/mL
OPIATE SCREEN URINE: NEGATIVE ng/mL
OXYCODONE+OXYMORPHONE UR QL SCN: NEGATIVE ng/mL
PH UR, DRUG SCRN: 5.8 (ref 4.5–8.9)
PROPOXYPHENE SCREEN URINE: NEGATIVE ng/mL
Phencyclidine Qn, Ur: NEGATIVE ng/mL
SPECIFIC GRAVITY: 1.022
TRAMADOL SCREEN, URINE: NEGATIVE ng/mL

## 2016-06-18 ENCOUNTER — Telehealth: Payer: Self-pay

## 2016-06-18 NOTE — Telephone Encounter (Signed)
-----   Message from Tamsen Roersennis E Chrismon, [place] sent at 06/17/2016  9:00 PM EDT ----- Drug screen negative. Schedule appointment to review health, discuss reason for needing drug screen and follow up diabetes..Marland Kitchen

## 2016-06-18 NOTE — Telephone Encounter (Signed)
Advised patient as below. Patient reports that due to schedule, she will call back and schedule an appt.

## 2016-06-18 NOTE — Telephone Encounter (Signed)
Tried calling; pt's voice mail has not been set up yet.   Thanks,   -Vernona RiegerLaura

## 2016-06-20 ENCOUNTER — Telehealth: Payer: Self-pay | Admitting: Family Medicine

## 2016-06-20 DIAGNOSIS — Z111 Encounter for screening for respiratory tuberculosis: Secondary | ICD-10-CM

## 2016-06-20 NOTE — Telephone Encounter (Signed)
Pt is requesting a lab slip to have a TB Skin test done. Pt is coming in tomorrow for a flu shot and would like to have this done tomorrow if possible.  GN#[phone]/QICB#[phone]/MW

## 2016-06-21 ENCOUNTER — Ambulatory Visit (INDEPENDENT_AMBULATORY_CARE_PROVIDER_SITE_OTHER): Payer: BC Managed Care – PPO

## 2016-06-21 ENCOUNTER — Other Ambulatory Visit: Payer: Self-pay

## 2016-06-21 DIAGNOSIS — Z111 Encounter for screening for respiratory tuberculosis: Secondary | ICD-10-CM

## 2016-06-21 DIAGNOSIS — Z23 Encounter for immunization: Secondary | ICD-10-CM | POA: Diagnosis not present

## 2016-06-21 DIAGNOSIS — Z789 Other specified health status: Secondary | ICD-10-CM

## 2016-06-21 NOTE — Telephone Encounter (Signed)
Will place future order in chart that can be printed out when she comes in to the office. Remind her that we don't do skin tests - we do quantiferon gold blood test for TB.

## 2016-06-25 ENCOUNTER — Telehealth: Payer: Self-pay

## 2016-06-25 LAB — QUANTIFERON IN TUBE
QFT TB AG MINUS NIL VALUE: 0.01 [IU]/mL
QUANTIFERON TB AG VALUE: 0.05 [IU]/mL
QUANTIFERON TB GOLD: NEGATIVE
Quantiferon Nil Value: 0.04 IU/mL

## 2016-06-25 LAB — QUANTIFERON TB GOLD ASSAY (BLOOD)

## 2016-06-25 LAB — VARICELLA ZOSTER ABS, IGG/IGM: Varicella zoster IgG: 1136 index (ref >165)

## 2016-06-25 NOTE — Telephone Encounter (Signed)
Advised patient of results.  

## 2016-06-25 NOTE — Telephone Encounter (Signed)
-----   Message from Tamsen Roersennis E Chrismon, [place] sent at 06/25/2016  7:47 AM EDT ----- Negative test for TB. Copy available to patient if needed.

## 2016-06-25 NOTE — Telephone Encounter (Signed)
-----   Message from Jodell Ciproennis E Shawneetownhrismon, [place] sent at 06/22/2016  5:47 PM EDT ----- Very good immunity to varicella (high IgG) with no evidence of active disease (negative IgM). Provide copy to patient if needed.

## 2016-06-25 NOTE — Telephone Encounter (Signed)
Tried calling patient and number was busy. Will try again later.  

## 2016-07-16 ENCOUNTER — Encounter: Payer: Self-pay | Admitting: Family Medicine

## 2016-07-16 ENCOUNTER — Ambulatory Visit (INDEPENDENT_AMBULATORY_CARE_PROVIDER_SITE_OTHER): Payer: BC Managed Care – PPO | Admitting: Family Medicine

## 2016-07-16 VITALS — BP 130/80 | HR 94 | Temp 98.3°F | Resp 16 | Wt 189.0 lb

## 2016-07-16 DIAGNOSIS — S161XXA Strain of muscle, fascia and tendon at neck level, initial encounter: Secondary | ICD-10-CM

## 2016-07-16 NOTE — Patient Instructions (Signed)
Discussed use of Advil 400 mg. 3 x day with food. Start cold compresses to your neck for 20 minutes several times/day for first 48 hours then go to heat to improve healing and reduce spasms.
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
## 2016-07-18 ENCOUNTER — Telehealth: Payer: Self-pay | Admitting: Family Medicine

## 2016-07-18 NOTE — Telephone Encounter (Signed)
Okay to approve note and for how long? KW

## 2016-07-18 NOTE — Telephone Encounter (Signed)
Excuse for 10/16-10/20.
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
## 2016-07-19 ENCOUNTER — Telehealth: Payer: Self-pay | Admitting: Family Medicine

## 2016-07-19 NOTE — Telephone Encounter (Signed)
Patient states that she is still having neck and left shoulder pain and needs it looked at further.  She states that she needs you to refer her or "whatever you need to do."  She states that there is nothing you can do here.

## 2016-07-19 NOTE — Telephone Encounter (Signed)
Please advise patient has scheduled appt on Monday to f/u. KW

## 2016-07-19 NOTE — Telephone Encounter (Signed)
Not available up front for pick up patient will be notified. KW

## 2016-07-23 ENCOUNTER — Ambulatory Visit (INDEPENDENT_AMBULATORY_CARE_PROVIDER_SITE_OTHER): Payer: BC Managed Care – PPO | Admitting: Family Medicine

## 2016-07-23 ENCOUNTER — Encounter: Payer: Self-pay | Admitting: Family Medicine

## 2016-07-23 ENCOUNTER — Ambulatory Visit
Admission: RE | Admit: 2016-07-23 | Discharge: 2016-07-23 | Disposition: A | Discharge status: Home / Self Care | Payer: BC Managed Care – PPO | Source: Ambulatory Visit | Attending: Family Medicine | Admitting: Family Medicine

## 2016-07-23 VITALS — BP 128/68 | HR 80 | Temp 98.8°F | Resp 16 | Wt 189.0 lb

## 2016-07-23 DIAGNOSIS — M542 Cervicalgia: Secondary | ICD-10-CM | POA: Insufficient documentation

## 2016-07-23 DIAGNOSIS — S161XXA Strain of muscle, fascia and tendon at neck level, initial encounter: Secondary | ICD-10-CM

## 2016-07-23 DIAGNOSIS — M479 Spondylosis, unspecified: Secondary | ICD-10-CM | POA: Insufficient documentation
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
## 2016-07-23 NOTE — Patient Instructions (Signed)
We will call you with the x-ray report.Continue current treatment of diazepam, ibuprofen, and warm compresses.

## 2016-11-20 ENCOUNTER — Telehealth: Payer: Self-pay | Admitting: Family Medicine

## 2016-11-20 NOTE — Telephone Encounter (Signed)
Attempted to contact patient unable to reach her at this time, voicemail box is full. Will try and contact again at a later time. KW
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
## 2016-11-20 NOTE — Telephone Encounter (Signed)
Patient advised and follow up appt scheduled for 2/22.KW

## 2016-11-20 NOTE — Telephone Encounter (Signed)
Please review. Thanks!  
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
## 2016-11-22 ENCOUNTER — Ambulatory Visit (INDEPENDENT_AMBULATORY_CARE_PROVIDER_SITE_OTHER): Payer: BC Managed Care – PPO | Admitting: Family Medicine

## 2016-11-22 ENCOUNTER — Encounter: Payer: Self-pay | Admitting: Family Medicine

## 2016-11-22 DIAGNOSIS — M542 Cervicalgia: Secondary | ICD-10-CM

## 2016-11-22 DIAGNOSIS — G8929 Other chronic pain: Secondary | ICD-10-CM

## 2016-11-22 NOTE — Patient Instructions (Signed)
Please schedule diabetes f/u with Maurine Ministerennis.
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
## 2017-02-21 LAB — HM DIABETES EYE EXAM

## 2017-04-19 ENCOUNTER — Other Ambulatory Visit: Payer: Self-pay | Admitting: Family Medicine

## 2017-04-30 ENCOUNTER — Telehealth: Payer: Self-pay | Admitting: Family Medicine

## 2017-04-30 NOTE — Telephone Encounter (Signed)
Need breast exam before mammograms.

## 2017-04-30 NOTE — Telephone Encounter (Signed)
Pt is requesting an [facility] for a D[facility] mamm[facility] [place]. Please advise. Thanks TNP

## 2017-04-30 NOTE — Telephone Encounter (Signed)
Cheyenne MinisterDennis spoke with patient and advised her. Patient is going to check to see if [facility] will order mammogram.

## 2017-04-30 NOTE — Telephone Encounter (Signed)
Attempted to contact patient to advise her to schedule a CPE appointment so she can have a breast exam prior to ordering mammogram per Maurine Ministerennis. No answer and voicemail has not been set up.

## 2017-05-31 LAB — HM MAMMOGRAPHY

## 2017-06-04 ENCOUNTER — Encounter: Payer: Self-pay | Admitting: Family Medicine

## 2017-06-06 ENCOUNTER — Encounter: Payer: Self-pay | Admitting: Family Medicine

## 2017-06-12 ENCOUNTER — Telehealth: Payer: Self-pay | Admitting: Family Medicine

## 2017-06-12 NOTE — Telephone Encounter (Signed)
Pt called wanting to know results from her mammogram  Her call back is [phone]  Thanks teri

## 2017-06-13 NOTE — Telephone Encounter (Signed)
Have not received the ultrasound report on the right breast mass that Dr. Otilio ConnorsGrate saw on the mammograms and said you would be advised and the ultrasound scheduled. Did he not call you or schedule the ultrasound? (see second page of mammogram report).

## 2017-06-13 NOTE — Telephone Encounter (Signed)
No answer and unable to leave a message. Voicemail has not been set up.

## 2017-06-17 ENCOUNTER — Other Ambulatory Visit: Payer: Self-pay | Admitting: Family Medicine

## 2017-06-17 NOTE — Telephone Encounter (Signed)
[facility]ed a request for a 90-days supply for the following medication. Thanks CC  metFORMIN (GLUCOPHAGE) 1000 MG tablet  Take 1/2 tablet by mouth each evening and 1 tablet by mouth each morning.

## 2017-06-17 NOTE — Telephone Encounter (Signed)
Please review. Thanks!  

## 2017-06-18 NOTE — Telephone Encounter (Signed)
Acknowledged.

## 2017-06-18 NOTE — Telephone Encounter (Signed)
Patient advised. She states she has not been scheduled for a ultrasound. She states she will contact [facility] to check on ultrasound appointment.

## 2017-06-21 ENCOUNTER — Other Ambulatory Visit: Payer: Self-pay | Admitting: Family Medicine

## 2017-07-12 ENCOUNTER — Encounter: Payer: Self-pay | Admitting: Family Medicine

## 2017-07-12 LAB — HM MAMMOGRAPHY

## 2017-08-16 ENCOUNTER — Other Ambulatory Visit: Payer: Self-pay | Admitting: Family Medicine

## 2017-08-16 MED ORDER — METFORMIN HCL 1000 MG PO TABS: ORAL_TABLET | ORAL | 0 refills | Status: DC

## 2017-08-16 NOTE — Telephone Encounter (Signed)
Agree. Have not seen her in a year.

## 2017-08-16 NOTE — Telephone Encounter (Signed)
Per Maurine Ministerennis sent a 1 month supply [facility] and advised patient that she must follow up in December for any further refills. She verbalized understanding.

## 2017-08-16 NOTE — Telephone Encounter (Signed)
Pt contacted office for refill request on the following medications:  metFORMIN (GLUCOPHAGE) 1000 MG tablet  [facility] [place].  ZO#[phone]/WJCB#[phone]/MW

## 2017-09-11 ENCOUNTER — Other Ambulatory Visit: Payer: Self-pay | Admitting: Family Medicine

## 2017-09-13 ENCOUNTER — Encounter: Payer: Self-pay | Admitting: Family Medicine

## 2017-09-13 ENCOUNTER — Ambulatory Visit (INDEPENDENT_AMBULATORY_CARE_PROVIDER_SITE_OTHER): Payer: BC Managed Care – PPO | Admitting: Family Medicine

## 2017-09-13 VITALS — BP 148/72 | HR 57 | Temp 98.2°F | Resp 16 | Ht 61.0 in | Wt 191.2 lb

## 2017-09-13 DIAGNOSIS — E119 Type 2 diabetes mellitus without complications: Secondary | ICD-10-CM | POA: Diagnosis not present

## 2017-09-13 DIAGNOSIS — E782 Mixed hyperlipidemia: Secondary | ICD-10-CM

## 2017-09-13 DIAGNOSIS — I10 Essential (primary) hypertension: Secondary | ICD-10-CM

## 2017-09-13 LAB — POCT GLYCOSYLATED HEMOGLOBIN (HGB A1C): Hemoglobin A1C: 8.4

## 2017-09-13 LAB — POCT UA - MICROALBUMIN: MICROALBUMIN (UR) POC: 20 mg/L

## 2017-09-13 NOTE — Progress Notes (Signed)
Patient: Cheyenne Acosta Female    DOB: [birth year]   46 y.o.   MRN: [id number] Visit Date: 09/13/2017  Today's Provider: Dortha Kernennis Chrismon, PA   Chief Complaint  Patient presents with  . Diabetes  . Hyperlipidemia  . Follow-up   Subjective:    HPI   Diabetes Mellitus Type II, Follow-up:   Lab Results  Component Value Date   HGBA1C 8.6 (H) 04/16/2016    Last seen for diabetes more than 1 year ago.  Management since then includes no changes. She reports fair compliance with treatment. Not checking check sugars  She is not having side effects.  Current symptoms include none and have been stable. Home blood sugar records: are not being checked  Episodes of hypoglycemia? no              Current Insulin Regimen: none Most Recent Eye Exam: last year Weight trend: stable Prior visit with dietician: no Current diet: well balanced Current exercise: none  Lab Results  Component Value Date   CHOL 266 (H) 04/16/2016   HDL 70 04/16/2016   LDLCALC 186 (H) 04/16/2016   TRIG 48 04/16/2016   CHOLHDL 3.8 04/16/2016   Wt Readings from Last 3 Encounters:  09/13/17 191 lb 3.2 oz (86.7 kg)  11/22/16 195 lb 12.8 oz (88.8 kg)  07/23/16 189 lb (85.7 kg)   ------------------------------------------------------------------------  Lipid/Cholesterol, Follow-up:   Last seen for this more than 1 year ago.  Management changes since that visit include continue low fat diet and fish oil.    Last Lipid Panel: Lab Results  Component Value Date   CHOL 266 (H) 04/16/2016   HDL 70 04/16/2016   LDLCALC 186 (H) 04/16/2016   TRIG 48 04/16/2016   CHOLHDL 3.8 04/16/2016   Risk factors for vascular disease include diabetes mellitus  She reports fair compliance with treatment. She is not having side effects.  Weight trend: stable Prior visit with dietician: no Current diet: well balanced Current exercise: none  Wt Readings from Last 3 Encounters:  09/13/17 191 lb 3.2 oz  (86.7 kg)  11/22/16 195 lb 12.8 oz (88.8 kg)  07/23/16 189 lb (85.7 kg)    -------------------------------------------------------------------  History reviewed. No pertinent past medical history. Patient Active Problem List   Diagnosis Date Noted  . Chronic neck pain 11/22/2016  . Absence of menstruation 09/14/2015  . Essential (primary) hypertension 09/14/2015  . Adjustment disorder with anxiety 09/14/2015  . Advanced maternal age in multigravida 08/12/2014  . H/O previous obstetrical problem 08/12/2014  . Esophagitis, reflux 07/22/2006  . Diabetes (HCC) 07/15/2006  . HLD (hyperlipidemia) 07/15/2006   Past Surgical History:  Procedure Laterality Date  . CESAREAN SECTION     Family History  Problem Relation Age of Onset  . Diabetes Mother   . Heart attack Mother   . Prostate cancer Father   . Hypertension Father   . Healthy Son    Allergies  Allergen Reactions  . Aspirin   . Latex   . Sulfa Antibiotics Rash    Current Outpatient Medications:  .  diazepam (VALIUM) 5 MG tablet, Take 1 tablet (5 mg total) by mouth every 12 (twelve) hours as needed for anxiety., Disp: 30 tablet, Rfl: 0 .  metFORMIN (GLUCOPHAGE) 1000 MG tablet, TAKE 1/2 TABLET BY MOUTH EACH EVENING AND TAKE 1 TABLET BY MOUTH EACH MORNING, Disp: 60 tablet, Rfl: 0 .  Omega-3 Fatty Acids (FISH OIL) 1200 MG CAPS, Take by mouth., Disp: , Rfl:  Review of Systems  Constitutional: Negative.   Respiratory: Negative.   Cardiovascular: Negative.   Endocrine: Negative.   Musculoskeletal: Negative.     Social History   Tobacco Use  . Smoking status: Never Smoker  . Smokeless tobacco: Never Used  Substance Use Topics  . Alcohol use: No    Alcohol/week: 0.0 oz   Objective:   BP (!) 148/72 (BP Location: Right Arm, Patient Position: Sitting, Cuff Size: Normal)   Pulse (!) 57   Temp 98.2 F (36.8 C) (Oral)   Resp 16   Ht 5\' 1"  (1.549 m)   Wt 191 lb 3.2 oz (86.7 kg)   SpO2 97%   BMI 36.13 kg/m    Physical Exam  Constitutional: She is oriented to person, place, and time. She appears well-developed and well-nourished. No distress.  HENT:  Head: Normocephalic and atraumatic.  Right Ear: Hearing normal.  Left Ear: Hearing normal.  Nose: Nose normal.  Eyes: Conjunctivae and lids are normal. Right eye exhibits no discharge. Left eye exhibits no discharge. No scleral icterus.  Pulmonary/Chest: Effort normal. No respiratory distress.  Musculoskeletal: Normal range of motion.  Neurological: She is alert and oriented to person, place, and time.  Skin: Skin is intact. No lesion and no rash noted.  Psychiatric: She has a normal mood and affect. Her speech is normal and behavior is normal. Thought content normal.    Depression screen PHQ 2/9 09/13/2017  Decreased Interest 0  Down, Depressed, Hopeless 0  PHQ - 2 Score 0  Altered sleeping 0  Tired, decreased energy 0  Change in appetite 0  Feeling bad or failure about yourself  0  Trouble concentrating 0  Moving slowly or fidgety/restless 0  Suicidal thoughts 0  PHQ-9 Score 0  Difficult doing work/chores Not difficult at all   Diabetic Foot Exam - Simple   Simple Foot Form Diabetic Foot exam was performed with the following findings:  Yes 09/13/2017 12:19 PM  Visual Inspection Sensation Testing Pulse Check Comments        Assessment & Plan:     1. Type 2 diabetes mellitus without complication, without long-term current use of insulin (HCC) Has not been following FBS. Still taking Metformin 1000 mg 1/2 tablet each morning and 1 tablet each evening. Denies polydipsia, polyphagia, polyuria or hypoglycemic episodes. Hgb A1C 8.4% today with microalbumin of 20 mg/L. Recommend increasing Metformin to 1000 mg BID and continuing Ramipril. Recheck routine labs and follow up pending reports (reminded patient routine follow up needed to maintain diabetes control). Declines any immunizations today. - POCT UA - Microalbumin - POCT HgB  A1C - CBC with Differential/Platelet - Lipid panel - COMPLETE METABOLIC PANEL WITH GFR  2. Essential (primary) hypertension Fair BP control. Needs to continue Ramipril 5 mg qd and get routine follow up labs. Recheck pending reports. - CBC with Differential/Platelet - Lipid panel - COMPLETE METABOLIC PANEL WITH GFR - TSH  3. Mixed hyperlipidemia Has tried to control weight and follow a low fat diet. Still taking Omega-3 Fish Oil 1200 mg qd. Lipids on 04-16-16 showed a total cholesterol of 266, HDL 70 and LDL 186. Declines statins. Will recheck labs to assess progress. - Lipid panel - COMPLETE METABOLIC PANEL WITH GFR - TSH       Dortha Kernennis Chrismon, PA  [facility]

## 2017-09-22 ENCOUNTER — Encounter: Payer: Self-pay | Admitting: Family Medicine

## 2017-09-23 ENCOUNTER — Other Ambulatory Visit: Payer: Self-pay | Admitting: Family Medicine

## 2017-09-23 MED ORDER — RAMIPRIL 5 MG PO CAPS: 5.0000 mg | ORAL_CAPSULE | Freq: Every day | ORAL | 0 refills | Status: DC

## 2017-09-23 MED ORDER — METFORMIN HCL 1000 MG PO TABS: ORAL_TABLET | ORAL | 0 refills | Status: DC

## 2017-10-10 ENCOUNTER — Other Ambulatory Visit: Payer: Self-pay | Admitting: Family Medicine

## 2017-10-11 ENCOUNTER — Telehealth: Payer: Self-pay

## 2017-10-11 LAB — CBC WITH DIFFERENTIAL/PLATELET
BASOS: 0 %
Basophils Absolute: 0 10*3/uL (ref 0.0–0.2)
EOS (ABSOLUTE): 0.2 10*3/uL (ref 0.0–0.4)
Eos: 4 %
HEMATOCRIT: 32.7 % — AB (ref 34.0–46.6)
Hemoglobin: 9.9 g/dL — ABNORMAL LOW (ref 11.1–15.9)
IMMATURE GRANULOCYTES: 0 %
Immature Grans (Abs): 0 10*3/uL (ref 0.0–0.1)
LYMPHS ABS: 2.3 10*3/uL (ref 0.7–3.1)
Lymphs: 39 %
MCH: 22.2 pg — AB (ref 26.6–33.0)
MCHC: 30.3 g/dL — AB (ref 31.5–35.7)
MCV: 74 fL — AB (ref 79–97)
MONOS ABS: 0.5 10*3/uL (ref 0.1–0.9)
Monocytes: 9 %
Neutrophils Absolute: 2.8 10*3/uL (ref 1.4–7.0)
Neutrophils: 48 %
PLATELETS: 275 10*3/uL (ref 150–379)
RBC: 4.45 x10E6/uL (ref 3.77–5.28)
RDW: 17.5 % — AB (ref 12.3–15.4)
WBC: 5.8 10*3/uL (ref 3.4–10.8)

## 2017-10-11 LAB — COMPREHENSIVE METABOLIC PANEL
ALK PHOS: 59 IU/L (ref 39–117)
ALT: 11 IU/L (ref 0–32)
AST: 11 IU/L (ref 0–40)
Albumin/Globulin Ratio: 1.2 (ref 1.2–2.2)
Albumin: 4.1 g/dL (ref 3.5–5.5)
BILIRUBIN TOTAL: 0.3 mg/dL (ref 0.0–1.2)
BUN/Creatinine Ratio: 11 (ref 9–23)
BUN: 9 mg/dL (ref 6–24)
CALCIUM: 9.3 mg/dL (ref 8.7–10.2)
CHLORIDE: 104 mmol/L (ref 96–106)
CO2: 22 mmol/L (ref 20–29)
Creatinine, Ser: 0.8 mg/dL (ref 0.57–1.00)
GFR calc Af Amer: 101 mL/min/{1.73_m2} (ref >59)
GFR calc non Af Amer: 87 mL/min/{1.73_m2} (ref >59)
GLOBULIN, TOTAL: 3.3 g/dL (ref 1.5–4.5)
Glucose: 136 mg/dL — ABNORMAL HIGH (ref 65–99)
Potassium: 4.7 mmol/L (ref 3.5–5.2)
SODIUM: 139 mmol/L (ref 134–144)
Total Protein: 7.4 g/dL (ref 6.0–8.5)

## 2017-10-11 LAB — LIPID PANEL W/O CHOL/HDL RATIO
CHOLESTEROL TOTAL: 257 mg/dL — AB (ref 100–199)
HDL: 65 mg/dL (ref >39)
LDL Calculated: 182 mg/dL — ABNORMAL HIGH (ref 0–99)
TRIGLYCERIDES: 48 mg/dL (ref 0–149)
VLDL Cholesterol Cal: 10 mg/dL (ref 5–40)

## 2017-10-11 LAB — TSH: TSH: 0.626 u[IU]/mL (ref 0.450–4.500)

## 2017-10-11 NOTE — Telephone Encounter (Signed)
No answer, mailbox not set [employer]-Anastasiya V Hopkins, RMA

## 2017-10-11 NOTE — Telephone Encounter (Signed)
-----  Message from Margo Common, [place] sent at 10/11/2017  8:39 AM EST ----- Hgb and Hct very low. Ask lab to run serum iron and IBC from this sample. Should get OC-Light kit to test stool for blood to rule out GI bleeding. Blood sugar better than a year ago but with Hgb A1C above 8%, should continue Metformin at 1000 mg BID. LDL cholesterol not really better than a year ago. American Diabetes Association recommends all diabetic should take a statin to control cholesterol and lower risk for cardiovascular events. Recommend Pravastatin 20 mg qd #90 & 3 RF.

## 2017-12-05 ENCOUNTER — Telehealth: Payer: Self-pay

## 2017-12-05 ENCOUNTER — Ambulatory Visit: Payer: BC Managed Care – PPO | Admitting: Physician Assistant

## 2017-12-05 ENCOUNTER — Encounter: Payer: Self-pay | Admitting: Physician Assistant

## 2017-12-05 VITALS — BP 142/70 | HR 78 | Resp 16 | Wt 187.0 lb

## 2017-12-05 DIAGNOSIS — R1013 Epigastric pain: Secondary | ICD-10-CM

## 2017-12-05 DIAGNOSIS — R079 Chest pain, unspecified: Secondary | ICD-10-CM | POA: Diagnosis not present

## 2017-12-05 DIAGNOSIS — R0789 Other chest pain: Secondary | ICD-10-CM | POA: Diagnosis not present

## 2017-12-05 NOTE — Telephone Encounter (Signed)
Noted, thank you

## 2017-12-05 NOTE — Progress Notes (Signed)
Patient: Cheyenne Acosta Female    DOB: 25-Jan-1971   47 y.o.   MRN: [id number] Visit Date: 12/05/2017  Today's Provider: Trey Sailors, PA-C   Chief Complaint  Patient presents with  . Chest Pain   Subjective:    HPI Cheyenne Acosta is a 47 y/o woman with uncontrolled Type II DM since age 15, uncontrolled HLD not on statin therapy who comes in today c/o a heaviness sensation in her chest ongoing for one day. 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 She reports that she has been taking Tums and pepto bismol thinking that her symptoms could be reflux or gas. She reports that the pain is located on the right side of her chest. Denies numbness or tingling in her extremities. Denies blurred vision and shortness of breath. She also denies headaches or dizziness. She denies radiation of pain or worsening of pain with breathing. She denies nausea and vomiting. She denies drug use. She denies injury or heavy lifting. She does mention that she occasionally feels light headed, but it does not last very long.     Allergies  Allergen Reactions  . Aspirin   . Latex   . Sulfa Antibiotics Rash     Current Outpatient Medications:  .  diazepam (VALIUM) 5 MG tablet, Take 1 tablet (5 mg total) by mouth every 12 (twelve) hours as needed for anxiety., Disp: 30 tablet, Rfl: 0 .  metFORMIN (GLUCOPHAGE) 1000 MG tablet, TAKE 1/2 TABLET BY MOUTH EACH EVENING AND TAKE 1 TABLET BY MOUTH EACH MORNING, Disp: 135 tablet, Rfl: 0 .  Omega-3 Fatty Acids (FISH OIL) 1200 MG CAPS, Take by mouth., Disp: , Rfl:  .  ramipril (ALTACE) 5 MG capsule, Take 1 capsule (5 mg total) by mouth daily., Disp: 90 capsule, Rfl: 0  Review of Systems  Constitutional: Positive for fatigue. Negative for activity change, chills, fever and unexpected weight change.  Respiratory: Negative for shortness of breath.   Cardiovascular: Positive for chest pain. Negative for  palpitations and leg swelling.       Describes pain as a heaviness sensation.   Musculoskeletal: Negative for arthralgias.  Neurological: Positive for light-headedness. Negative for dizziness, tremors, seizures, syncope, weakness, numbness and headaches.  Psychiatric/Behavioral: The patient is nervous/anxious.     Social History   Tobacco Use  . Smoking status: Never Smoker  . Smokeless tobacco: Never Used  Substance Use Topics  . Alcohol use: No    Alcohol/week: 0.0 oz   Objective:   BP (!) 142/70   Pulse 78   Resp 16   Wt 187 lb (84.8 kg)   SpO2 98%   BMI 35.33 kg/m  Vitals:   12/05/17 1502  BP: (!) 142/70  Pulse: 78  Resp: 16  SpO2: 98%  Weight: 187 lb (84.8 kg)     Physical Exam  Constitutional: She is oriented to person, place, and time. She appears well-developed and well-nourished.  Cardiovascular: Normal rate and regular rhythm.  Pulmonary/Chest: Effort normal and breath sounds normal. She exhibits no tenderness.  Abdominal: Soft. Bowel sounds are normal.  Neurological: She is alert and oriented to person, place, and time.  Skin: Skin is warm and dry.  Psychiatric: She has a normal mood and affect. Her behavior is normal.        Assessment & Plan:     1. Chest tightness  I have gone over with patient in  great detail her EKG. It shows some T-wave inversions in leads III, VI, and VIII. It does not show ST elevations. Given her her history of longstanding uncontrolled Type II DM and uncontrolled HLD, I do think this may represent possible NSTEMI or anginal equivalent. Other differentials include GERD, cholelithiasis, pancreatitis, chest wall pain, and anxiety. I do not think it would be unreasonable to go the ER for a complete work up, patient refuses. Next, I offer that we refer her to cardiology for further workup including stress testing due to her risk factors and she refuses. Additionally, I recommend she take 325 mg ASA and she says she has an allergy,  stomach upset. I counsel that this is not an allergy and she says that she will not take it unless we have enteric coated ASA, which we do not in this clinic. She refuses any emergent workup because she does not have "textbook" symptoms and I explain that women and diabetic patients may not present typically. She continues to refuse. I have counseled that by refusing more urgent workup, it is possible that we may be missing an NSTEMI and she expresses understanding. Thus I will get the labwork below. She should seek emergent care if symptoms worsen. She does however want a work note.  - EKG 12-Lead - Troponin I  2. Chest pain, unspecified type  - Troponin I  3. Epigastric pain  - Comprehensive Metabolic Panel (CMET) - CBC with Differential - Lipase - Amylase  Return if symptoms worsen or fail to improve.  The entirety of the information documented in the History of Present Illness, Review of Systems and Physical Exam were personally obtained by me. Portions of this information were initially documented by Anson [place]achelle Presley, CMA and reviewed by me for thoroughness and accuracy.        Trey SailorsAdriana M Jensine Luz, PA-C  [facility]

## 2017-12-05 NOTE — Patient Instructions (Signed)
Chest Wall Pain °Chest wall pain is pain in or around the bones and muscles of your chest. Sometimes, an injury causes this pain. Sometimes, the cause may not be known. This pain may take several weeks or longer to get better. °Follow these instructions at home: °Pay attention to any changes in your symptoms. Take these actions to help with your pain: °· Rest as told by your doctor. °· Avoid activities that cause pain. Try not to use your chest, belly (abdominal), or side muscles to lift heavy things. °· If directed, apply ice to the painful area: °? Put ice in a plastic bag. °? Place a towel between your skin and the bag. °? Leave the ice on for 20 minutes, 2-3 times per day. °· Take over-the-counter and prescription medicines only as told by your doctor. °· Do not use tobacco products, including cigarettes, chewing tobacco, and e-cigarettes. If you need help quitting, ask your doctor. °· Keep all follow-up visits as told by your doctor. This is important. ° °Contact a doctor if: °· You have a fever. °· Your chest pain gets worse. °· You have new symptoms. °Get help right away if: °· You feel sick to your stomach (nauseous) or you throw up (vomit). °· You feel sweaty or light-headed. °· You have a cough with phlegm (sputum) or you cough up blood. °· You are short of breath. °This information is not intended to replace advice given to you by your health care provider. Make sure you discuss any questions you have with your health care provider. °Document Released: 03/05/2008 Document Revised: 02/23/2016 Document Reviewed: 12/13/2014 °Elsevier Interactive Patient Education © 2018 Elsevier Inc. ° °

## 2017-12-05 NOTE — Telephone Encounter (Signed)
Patient on scheduled with Adriana at 3:00 pm. Phone call was transferred to me to triage due to patient complaining about chest pressure off and on. Per patient it started yesterday. She denies sharp pain,SOB,jaw pain, arm pain, back pain, dizziness or blurry vision.  FYI: when started to asked patient about other symptoms patient keep saying "I'm fine,I think is just anxiety" and I already have an appointment, pt cut me off every time I asked a question.  Thanks,  -Joseline

## 2017-12-06 LAB — CBC WITH DIFFERENTIAL/PLATELET
Basophils Absolute: 0 10*3/uL (ref 0.0–0.2)
Basos: 0 %
EOS (ABSOLUTE): 0.3 10*3/uL (ref 0.0–0.4)
Eos: 5 %
Hematocrit: 31.9 % — ABNORMAL LOW (ref 34.0–46.6)
Hemoglobin: 9.6 g/dL — ABNORMAL LOW (ref 11.1–15.9)
Immature Grans (Abs): 0 10*3/uL (ref 0.0–0.1)
Immature Granulocytes: 0 %
Lymphocytes Absolute: 2.4 10*3/uL (ref 0.7–3.1)
Lymphs: 39 %
MCH: 21.9 pg — ABNORMAL LOW (ref 26.6–33.0)
MCHC: 30.1 g/dL — ABNORMAL LOW (ref 31.5–35.7)
MCV: 73 fL — ABNORMAL LOW (ref 79–97)
Monocytes Absolute: 0.6 10*3/uL (ref 0.1–0.9)
Monocytes: 9 %
Neutrophils Absolute: 2.8 10*3/uL (ref 1.4–7.0)
Neutrophils: 47 %
Platelets: 277 10*3/uL (ref 150–379)
RBC: 4.38 x10E6/uL (ref 3.77–5.28)
RDW: 17.7 % — ABNORMAL HIGH (ref 12.3–15.4)
WBC: 6 10*3/uL (ref 3.4–10.8)

## 2017-12-06 LAB — COMPREHENSIVE METABOLIC PANEL
ALT: 8 IU/L (ref 0–32)
AST: 7 IU/L (ref 0–40)
Albumin/Globulin Ratio: 1.2 (ref 1.2–2.2)
Albumin: 4.2 g/dL (ref 3.5–5.5)
Alkaline Phosphatase: 69 IU/L (ref 39–117)
BUN/Creatinine Ratio: 13 (ref 9–23)
BUN: 11 mg/dL (ref 6–24)
Bilirubin Total: 0.2 mg/dL (ref 0.0–1.2)
CO2: 23 mmol/L (ref 20–29)
Calcium: 9.6 mg/dL (ref 8.7–10.2)
Chloride: 100 mmol/L (ref 96–106)
Creatinine, Ser: 0.86 mg/dL (ref 0.57–1.00)
GFR calc Af Amer: 92 mL/min/{1.73_m2} (ref >59)
GFR calc non Af Amer: 80 mL/min/{1.73_m2} (ref >59)
Globulin, Total: 3.4 g/dL (ref 1.5–4.5)
Glucose: 102 mg/dL — ABNORMAL HIGH (ref 65–99)
Potassium: 4.5 mmol/L (ref 3.5–5.2)
Sodium: 138 mmol/L (ref 134–144)
Total Protein: 7.6 g/dL (ref 6.0–8.5)

## 2017-12-06 LAB — AMYLASE: Amylase: 77 U/L (ref 31–124)

## 2017-12-06 LAB — TROPONIN I: Troponin I: <0.01 ng/mL (ref 0.00–0.04)

## 2017-12-06 LAB — LIPASE: Lipase: 36 U/L (ref 14–72)

## 2017-12-06 NOTE — Telephone Encounter (Signed)
Pt advised.   Thanks,   -Rhilynn Preyer  

## 2017-12-06 NOTE — Telephone Encounter (Signed)
Need ROI to fax, not verbal permission. I will leave note through mychart and she can access it that way.

## 2017-12-06 NOTE — Telephone Encounter (Signed)
Pt is requesting a return to work note for date 12/09/17.  Pt states shw is giving us verbal permission to fax to [phone].

## 2017-12-09 ENCOUNTER — Telehealth: Payer: Self-pay

## 2017-12-09 NOTE — Telephone Encounter (Signed)
-----   Message from Trey Sailors, [place] sent at 12/06/2017  9:14 AM EST ----- CMET normal. CBC she is anemic, if she's agreeable add Fe+ TIBC+ Fer under dx microcytic anemia. Lipase and amylase normal. Troponin normal. She should follow up with Maurine Minister about anemia.

## 2017-12-09 NOTE — Telephone Encounter (Signed)
Tried calling; pt's voicemail has not been set up.   Thanks,   -Melady Chow 

## 2017-12-11 NOTE — Telephone Encounter (Signed)
Pt advised.  She refused to have the tests added.  I advised her to follow up with Angelique Blonderenise about the Anemia but she did not make an appointment with me.   Thanks,   -Vernona RiegerLaura

## 2017-12-12 NOTE — Telephone Encounter (Signed)
Noted, thank you

## 2017-12-18 ENCOUNTER — Other Ambulatory Visit: Payer: Self-pay | Admitting: Family Medicine

## 2017-12-18 NOTE — Telephone Encounter (Addendum)
[facility] faxed a refill request for a 90-days supply for the following medications. Thanks CC  metFORMIN (GLUCOPHAGE) 1000 MG tablet   ramipril (ALTACE) 5 MG capsule

## 2017-12-19 MED ORDER — RAMIPRIL 5 MG PO CAPS: 5.0000 mg | ORAL_CAPSULE | Freq: Every day | ORAL | 3 refills | Status: DC

## 2017-12-19 MED ORDER — METFORMIN HCL 1000 MG PO TABS: ORAL_TABLET | ORAL | 3 refills | Status: DC

## 2018-03-28 ENCOUNTER — Ambulatory Visit: Payer: BC Managed Care – PPO | Admitting: Family Medicine

## 2018-03-28 ENCOUNTER — Encounter: Payer: Self-pay | Admitting: Family Medicine

## 2018-03-28 VITALS — BP 130/70 | HR 85 | Temp 98.5°F | Resp 16 | Wt 182.0 lb

## 2018-03-28 DIAGNOSIS — I10 Essential (primary) hypertension: Secondary | ICD-10-CM | POA: Diagnosis not present

## 2018-03-28 DIAGNOSIS — Z1211 Encounter for screening for malignant neoplasm of colon: Secondary | ICD-10-CM

## 2018-03-28 DIAGNOSIS — G8929 Other chronic pain: Secondary | ICD-10-CM | POA: Diagnosis not present

## 2018-03-28 DIAGNOSIS — E119 Type 2 diabetes mellitus without complications: Secondary | ICD-10-CM | POA: Diagnosis not present

## 2018-03-28 DIAGNOSIS — M542 Cervicalgia: Secondary | ICD-10-CM

## 2018-03-28 MED ORDER — METHOCARBAMOL 750 MG PO TABS: 750.0000 mg | ORAL_TABLET | Freq: Four times a day (QID) | ORAL | 2 refills | Status: DC | PRN

## 2018-03-28 MED ORDER — IBUPROFEN 600 MG PO TABS: 600.0000 mg | ORAL_TABLET | Freq: Three times a day (TID) | ORAL | 0 refills | Status: DC | PRN
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
## 2018-04-03 LAB — CBC WITH DIFFERENTIAL/PLATELET
Basophils Absolute: 0 10*3/uL (ref 0.0–0.2)
Basos: 1 %
EOS (ABSOLUTE): 0.3 10*3/uL (ref 0.0–0.4)
EOS: 7 %
HEMATOCRIT: 34.2 % (ref 34.0–46.6)
Hemoglobin: 10.8 g/dL — ABNORMAL LOW (ref 11.1–15.9)
IMMATURE GRANULOCYTES: 0 %
Immature Grans (Abs): 0 10*3/uL (ref 0.0–0.1)
LYMPHS ABS: 2 10*3/uL (ref 0.7–3.1)
Lymphs: 39 %
MCH: 24.7 pg — ABNORMAL LOW (ref 26.6–33.0)
MCHC: 31.6 g/dL (ref 31.5–35.7)
MCV: 78 fL — ABNORMAL LOW (ref 79–97)
MONOS ABS: 0.4 10*3/uL (ref 0.1–0.9)
Monocytes: 8 %
NEUTROS ABS: 2.4 10*3/uL (ref 1.4–7.0)
Neutrophils: 45 %
Platelets: 240 10*3/uL (ref 150–450)
RBC: 4.38 x10E6/uL (ref 3.77–5.28)
RDW: 15.6 % — AB (ref 12.3–15.4)
WBC: 5.1 10*3/uL (ref 3.4–10.8)

## 2018-04-03 LAB — COMPREHENSIVE METABOLIC PANEL
A/G RATIO: 1.3 (ref 1.2–2.2)
ALT: 10 IU/L (ref 0–32)
AST: 15 IU/L (ref 0–40)
Albumin: 4.2 g/dL (ref 3.5–5.5)
Alkaline Phosphatase: 59 IU/L (ref 39–117)
BUN/Creatinine Ratio: 14 (ref 9–23)
BUN: 10 mg/dL (ref 6–24)
Bilirubin Total: 0.3 mg/dL (ref 0.0–1.2)
CALCIUM: 9.2 mg/dL (ref 8.7–10.2)
CHLORIDE: 105 mmol/L (ref 96–106)
CO2: 18 mmol/L — ABNORMAL LOW (ref 20–29)
Creatinine, Ser: 0.74 mg/dL (ref 0.57–1.00)
GFR, EST AFRICAN AMERICAN: 110 mL/min/{1.73_m2} (ref >59)
GFR, EST NON AFRICAN AMERICAN: 95 mL/min/{1.73_m2} (ref >59)
GLOBULIN, TOTAL: 3.2 g/dL (ref 1.5–4.5)
Glucose: 121 mg/dL — ABNORMAL HIGH (ref 65–99)
POTASSIUM: 4.3 mmol/L (ref 3.5–5.2)
SODIUM: 138 mmol/L (ref 134–144)
Total Protein: 7.4 g/dL (ref 6.0–8.5)

## 2018-04-03 LAB — TSH: TSH: 0.544 u[IU]/mL (ref 0.450–4.500)

## 2018-04-03 LAB — HEMOGLOBIN A1C
ESTIMATED AVERAGE GLUCOSE: 160 mg/dL
Hgb A1c MFr Bld: 7.2 % — ABNORMAL HIGH (ref 4.8–5.6)

## 2018-04-03 LAB — LIPID PANEL
CHOL/HDL RATIO: 3.8 ratio (ref 0.0–4.4)
Cholesterol, Total: 272 mg/dL — ABNORMAL HIGH (ref 100–199)
HDL: 72 mg/dL (ref >39)
LDL Calculated: 190 mg/dL — ABNORMAL HIGH (ref 0–99)
TRIGLYCERIDES: 52 mg/dL (ref 0–149)
VLDL Cholesterol Cal: 10 mg/dL (ref 5–40)

## 2018-04-07 ENCOUNTER — Telehealth: Payer: Self-pay

## 2018-04-07 NOTE — Telephone Encounter (Signed)
Patient advised. She declined starting Pravastatin she states she will continue to work on lifestyle modifications.   She states the FMLA forms need to be intermittent for flares and the only day she has mess work so far was the day she came here for a OV on 03/2818. She states she has flares at times though and forms need to say intermittent leave. She would like the forms faxed once completed.

## 2018-04-07 NOTE — Telephone Encounter (Signed)
For intermittent leave, need an estimate of frequency and duration of leaves. Suspect this is for the chronic or recurrent neck pain flares.

## 2018-04-07 NOTE — Telephone Encounter (Signed)
Patient states she only has the neck pain flares every couple months. She states the frequency can say 1 time per week up to 12 hours. She works 12 hour shifts. She states she is not out of work each week, but never knows when she will need to be out and her schedule also varies.

## 2018-04-07 NOTE — Telephone Encounter (Signed)
-----   Message from Tamsen Roersennis E Chrismon, [place] sent at 04/05/2018 11:00 AM EDT ----- Anemia slightly better. Normal electrolytes, thyroid,  liver and kidney function. Blood sugar in pretty good shape and Hgb A1C better than 6 months ago. Close to the goal of <7.0. Continue present diet, exercise, work on weight loss and Metformin dosage. Cholesterol worsening LDL. Newest ADA recommendations is for every diabetic to be on a statin and ACE inhibitor. You are already on the Ramipril (ACE) and need Pravastatin 20 mg qd #90 & 3 RF. Would like to know the dates you were out of work to complete this FMLA form.

## 2018-06-18 ENCOUNTER — Encounter: Payer: Self-pay | Admitting: Family Medicine

## 2018-06-18 ENCOUNTER — Ambulatory Visit (INDEPENDENT_AMBULATORY_CARE_PROVIDER_SITE_OTHER): Payer: BC Managed Care – PPO | Admitting: Family Medicine

## 2018-06-18 VITALS — BP 130/70 | HR 88 | Temp 98.3°F | Resp 20 | Wt 182.0 lb

## 2018-06-18 DIAGNOSIS — B349 Viral infection, unspecified: Secondary | ICD-10-CM

## 2018-06-18 NOTE — Progress Notes (Signed)
  Subjective:     Patient ID: Cheyenne Acosta, female   DOB: 03-16-1971, 47 y.o.   MRN: [id number] Chief Complaint  Patient presents with  . URI    Patient here today C/O cough, runny nose, sore throat, ear pain, abdominal pain, nausea and dizziness since Monday. Patient reports taking OTC Nyquil on Monday night.   HPI States she was exposed to a co-worker who was also ill. 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  Review of Systems     Objective:   Physical Exam  Constitutional: She appears well-developed and well-nourished. She has a sickly appearance.  Ears: T.M's intact without inflammation Throat: no tonsillar enlargement or exudate Neck: no cervical adenopathy Lungs: clear     Assessment:    1. Acute viral syndrome     Plan:    Continue Nyquil. Add Mucinex D. Work excuse for 9/16-9/20. May return to work 9/23.

## 2018-06-18 NOTE — Patient Instructions (Signed)
Continue Nyquil at bedtime. Add Mucinex D for congestion.

## 2018-09-12 ENCOUNTER — Ambulatory Visit (INDEPENDENT_AMBULATORY_CARE_PROVIDER_SITE_OTHER): Payer: BC Managed Care – PPO | Admitting: Family Medicine

## 2018-09-12 ENCOUNTER — Other Ambulatory Visit (HOSPITAL_COMMUNITY)
Admission: RE | Admit: 2018-09-12 | Discharge: 2018-09-12 | Disposition: A | Discharge status: Home / Self Care | Payer: BC Managed Care – PPO | Source: Ambulatory Visit | Attending: Family Medicine | Admitting: Family Medicine

## 2018-09-12 ENCOUNTER — Encounter: Payer: Self-pay | Admitting: Family Medicine

## 2018-09-12 VITALS — BP 130/64 | HR 83 | Temp 98.2°F | Resp 16 | Ht 61.0 in | Wt 187.0 lb

## 2018-09-12 DIAGNOSIS — Z124 Encounter for screening for malignant neoplasm of cervix: Secondary | ICD-10-CM | POA: Diagnosis not present

## 2018-09-12 DIAGNOSIS — Z0000 Encounter for general adult medical examination without abnormal findings: Secondary | ICD-10-CM | POA: Diagnosis not present

## 2018-09-12 DIAGNOSIS — Z23 Encounter for immunization: Secondary | ICD-10-CM | POA: Diagnosis not present

## 2018-09-12 DIAGNOSIS — M542 Cervicalgia: Secondary | ICD-10-CM

## 2018-09-12 DIAGNOSIS — Z6835 Body mass index (BMI) 35.0-35.9, adult: Secondary | ICD-10-CM

## 2018-09-12 DIAGNOSIS — E119 Type 2 diabetes mellitus without complications: Secondary | ICD-10-CM | POA: Diagnosis not present

## 2018-09-12 DIAGNOSIS — G8929 Other chronic pain: Secondary | ICD-10-CM

## 2018-09-12 DIAGNOSIS — I10 Essential (primary) hypertension: Secondary | ICD-10-CM

## 2018-09-12 DIAGNOSIS — E782 Mixed hyperlipidemia: Secondary | ICD-10-CM

## 2018-09-12 NOTE — Progress Notes (Signed)
Patient: Cheyenne Acosta, Female    DOB: 1971-09-04, 47 y.o.   MRN: [id number] Visit Date: 09/12/2018  Today's Provider: Vernie Murders, PA   Chief Complaint  Patient presents with  . Annual Exam   Subjective:    Annual physical exam Cheyenne Acosta is a 47 y.o. female who presents today for health maintenance and complete physical. She feels well. She reports exercising yes-walks 1 hour a day. She reports she is sleeping well.  ---------------------------------------------------------------   Diabetes Mellitus Type II, Follow-up:   Lab Results  Component Value Date   HGBA1C 7.2 (H) 04/02/2018   HGBA1C 8.4 09/13/2017   HGBA1C 8.6 (H) 04/16/2016   Last seen for diabetes 6 months ago.  Management since then includes; no changes. She reports good compliance with treatment. She is not having side effects. none Current symptoms include none and have been unchanged. Home blood sugar records: fasting range: 130  Episodes of hypoglycemia? no   Current Insulin Regimen: n/a Most Recent Eye Exam: due Weight trend: stable Prior visit with dietician: no Current diet: well balanced Current exercise: walking  ---------------------------------------------------------------   Hypertension, follow-up:  BP Readings from Last 3 Encounters:  09/12/18 130/64  06/18/18 130/70  03/28/18 130/70    She was last seen for hypertension 6 months ago.  BP at that visit was 130/70. Management since that visit includes; no changes.She reports good compliance with treatment. She is not having side effects. none She is exercising. She is not adherent to low salt diet.   Outside blood pressures are none. She is experiencing none.  Patient denies none.   Cardiovascular risk factors include diabetes mellitus.  Use of agents associated with hypertension: none.   ---------------------------------------------------------------    Lipid/Cholesterol, Follow-up:   Last seen  for this 1 years ago.  Management since that visit includes; no changes. Labs checked 04/02/2018-advised patient to start pravastatin 20 mg qd.  Last Lipid Panel:    Component Value Date/Time   CHOL 272 (H) 04/02/2018 1111   TRIG 52 04/02/2018 1111   HDL 72 04/02/2018 1111   CHOLHDL 3.8 04/02/2018 1111   LDLCALC 190 (H) 04/02/2018 1111    She reports good compliance with treatment. She is not having side effects. none  Wt Readings from Last 3 Encounters:  09/12/18 187 lb (84.8 kg)  06/18/18 182 lb (82.6 kg)  03/28/18 182 lb (82.6 kg)    ---------------------------------------------------------------   Review of Systems  Musculoskeletal: Positive for neck pain.  All other systems reviewed and are negative.   Social History      She  reports that she has never smoked. She has never used smokeless tobacco. She reports that she does not drink alcohol or use drugs.       Social History   Socioeconomic History  . Marital status: Married    Spouse name: Not on file  . Number of children: Not on file  . Years of education: Not on file  . Highest education level: Not on file  Occupational History  . Not on file  Social Needs  . Financial resource strain: Not on file  . Food insecurity:    Worry: Not on file    Inability: Not on file  . Transportation needs:    Medical: Not on file    Non-medical: Not on file  Tobacco Use  . Smoking status: Never Smoker  . Smokeless tobacco: Never Used  Substance and Sexual Activity  . Alcohol use: No  Alcohol/week: 0.0 standard drinks  . Drug use: No  . Sexual activity: Not on file  Lifestyle  . Physical activity:    Days per week: Not on file    Minutes per session: Not on file  . Stress: Not on file  Relationships  . Social connections:    Talks on phone: Not on file    Gets together: Not on file    Attends religious service: Not on file    Active member of club or organization: Not on file    Attends meetings of clubs  or organizations: Not on file    Relationship status: Not on file  Other Topics Concern  . Not on file  Social History Narrative  . Not on file    History reviewed. No pertinent past medical history.   Patient Active Problem List   Diagnosis Date Noted  . Chronic neck pain 11/22/2016  . Absence of menstruation 09/14/2015  . Essential (primary) hypertension 09/14/2015  . Adjustment disorder with anxiety 09/14/2015  . Advanced maternal age in multigravida 08/12/2014  . H/O previous obstetrical problem 08/12/2014  . Esophagitis, reflux 07/22/2006  . Diabetes ([place]) 07/15/2006  . HLD (hyperlipidemia) 07/15/2006    Past Surgical History:  Procedure Laterality Date  . CESAREAN SECTION      Family History        Family Status  Relation Name Status  . Mother  Deceased at age 28  . Father  Deceased at age 23  . Son  Alive  . PGM  Deceased at age 11        Her family history includes Diabetes in her mother; Healthy in her son; Heart attack in her mother; Hypertension in her father; Prostate cancer in her father.      Allergies  Allergen Reactions  . Aspirin   . Latex   . Sulfa Antibiotics Rash     Current Outpatient Medications:  .  ibuprofen (ADVIL,MOTRIN) 600 MG tablet, Take 1 tablet (600 mg total) by mouth every 8 (eight) hours as needed., Disp: 30 tablet, Rfl: 0 .  metFORMIN (GLUCOPHAGE) 1000 MG tablet, TAKE 1/2 TABLET BY MOUTH EACH EVENING AND TAKE 1 TABLET BY MOUTH EACH MORNING, Disp: 135 tablet, Rfl: 3 .  methocarbamol (ROBAXIN) 750 MG tablet, Take 1 tablet (750 mg total) by mouth every 6 (six) hours as needed for muscle spasms., Disp: 40 tablet, Rfl: 2 .  Omega-3 Fatty Acids (FISH OIL) 1200 MG CAPS, Take by mouth., Disp: , Rfl:  .  ramipril (ALTACE) 5 MG capsule, Take 1 capsule (5 mg total) by mouth daily., Disp: 90 capsule, Rfl: 3 .  diazepam (VALIUM) 5 MG tablet, Take 1 tablet (5 mg total) by mouth every 12 (twelve) hours as needed for anxiety. (Patient not  taking: Reported on 03/28/2018), Disp: 30 tablet, Rfl: 0   Patient Care Team: Lilas Diefendorf, Vickki Muff, PA as PCP - General (Family Medicine)      Objective:   Vitals: BP 130/64 (BP Location: Right Arm, Patient Position: Sitting, Cuff Size: Large)   Pulse 83   Temp 98.2 F (36.8 C) (Oral)   Resp 16   Ht 5' 1"  (1.549 m)   Wt 187 lb (84.8 kg)   SpO2 98%   BMI 35.33 kg/m  Wt Readings from Last 3 Encounters:  09/12/18 187 lb (84.8 kg)  06/18/18 182 lb (82.6 kg)  03/28/18 182 lb (82.6 kg)   Vitals:   09/12/18 1110  BP: 130/64  Pulse: 83  Resp: 16  Temp: 98.2 F (36.8 C)  TempSrc: Oral  SpO2: 98%  Weight: 187 lb (84.8 kg)  Height: 5' 1"  (1.549 m)     Physical Exam Constitutional:      Appearance: Normal appearance. She is well-developed. She is obese.  HENT:     Head: Normocephalic and atraumatic.     Right Ear: Tympanic membrane, ear canal and external ear normal.     Left Ear: Tympanic membrane, ear canal and external ear normal.     Nose: Nose normal.     Mouth/Throat:     Pharynx: Oropharynx is clear.  Eyes:     General:        Right eye: No discharge.     Conjunctiva/sclera: Conjunctivae normal.     Pupils: Pupils are equal, round, and reactive to light.  Neck:     Musculoskeletal: Normal range of motion and neck supple.     Thyroid: No thyromegaly.     Trachea: No tracheal deviation.  Cardiovascular:     Rate and Rhythm: Normal rate and regular rhythm.     Heart sounds: Normal heart sounds. No murmur.  Pulmonary:     Effort: Pulmonary effort is normal. No respiratory distress.     Breath sounds: Normal breath sounds. No wheezing or rales.  Chest:     Chest wall: No tenderness.  Abdominal:     General: Bowel sounds are normal. There is no distension.     Palpations: Abdomen is soft. There is no mass.     Tenderness: There is no abdominal tenderness. There is no guarding or rebound.  Genitourinary:    General: Normal vulva.     Rectum: Normal.      Comments: Slight discharge started with use of brush for PAP. Musculoskeletal: Normal range of motion.        General: No tenderness.  Lymphadenopathy:     Cervical: No cervical adenopathy.  Skin:    General: Skin is warm and dry.     Findings: No erythema or rash.  Neurological:     Mental Status: She is alert and oriented to person, place, and time.     Cranial Nerves: No cranial nerve deficit.     Motor: No abnormal muscle tone.     Coordination: Coordination normal.     Deep Tendon Reflexes: Reflexes are normal and symmetric. Reflexes normal.  Psychiatric:        Behavior: Behavior normal.        Thought Content: Thought content normal.        Judgment: Judgment normal.    Depression Screen PHQ 2/9 Scores 09/12/2018 09/13/2017  PHQ - 2 Score 0 0  PHQ- 9 Score 0 0   Assessment & Plan:     Routine Health Maintenance and Physical Exam  Exercise Activities and Dietary recommendations Goals   Recommend diabetic diet and work on weight reduction. Exercise 30-40 minutes 3-4 days a week.     Immunization History  Administered Date(s) Administered  . Hepatitis B 05/30/1999  . Influenza Split 08/12/2012  . Influenza,inj,Quad PF,6+ Mos 06/21/2016  . Influenza-Unspecified 08/15/2017  . MMR 05/30/1999    Health Maintenance  Topic Date Due  . PNEUMOCOCCAL POLYSACCHARIDE VACCINE AGE 16-64 HIGH RISK  10/27/1970  . PAP SMEAR-Modifier  06/03/2017  . OPHTHALMOLOGY EXAM  02/21/2018  . INFLUENZA VACCINE  05/01/2018  . MAMMOGRAM  07/12/2018  . TETANUS/TDAP  09/17/2018 (Originally 10/28/1987)  . FOOT EXAM  09/13/2018  . HEMOGLOBIN A1C  10/03/2018  . HIV Screening  Completed     Discussed health benefits of physical activity, and encouraged her to engage in regular exercise appropriate for her age and condition.    -------------------------------------------------------------------- 1. Annual physical exam General health good. Given anticipatory counseling. Encouraged to  exercise regularly and work on weight loss.  2. Pap smear for cervical cancer screening LMP approximately 1 month ago. Slight discharge started after using brush for PAP. Normal breast exam. Will be getting mammograms set up in the next couple weeks. - Cytology - PAP  3. Essential (primary) hypertension Well controlled and tolerating Ramipril 5 mg qd without edema or cough. No chest pains or palpitations. Recheck routine labs and follow up pending reports. - CBC with Differential/Platelet - Comprehensive metabolic panel  4. Type 2 diabetes mellitus without complication, without long-term current use of insulin (HCC) Denies hypoglycemic episodes, polyuria, polydipsia or vision changes. Encouraged to get ophthalmology exam annually. Will recheck labs and continue Metformin 1000 mg one tablet each morning and 1/2 tablet each evening. Should exercise 30-40 minutes 3-4 times a week. - CBC with Differential/Platelet - Comprehensive metabolic panel - Lipid panel - Hemoglobin A1c  5. Chronic neck pain Intermittent flares of pain and spasm when standing at a computer/desk or OR table for long periods of time. Uses Methocarbamol 750 mg QID prn flare of spasms and has to use frequent applications of moist heat or ice packs for 12 hours during these episodes. Completed FMLA forms to document need for leave once or twice a month on average. Recheck prn. No acute symptoms today.  6. Mixed hyperlipidemia Has refused statins despite increase in total cholesterol and LDL over the past year. Encouraged to work on low fat diet and weight loss. Recheck labs to assess progress. - Comprehensive metabolic panel - Lipid panel  7. BMI 35.0-35.9,adult Has gained 5 more lbs since 03-28-18. Will recheck labs for signs of metabolic disorders. Encouraged to exercise for 30-40 minutes 4-5 days a week and lower caloric intake. - CBC with Differential/Platelet - Comprehensive metabolic panel - TSH - Hemoglobin  A1c  8. Need for Tdap vaccination - Tdap vaccine greater than or equal to 7yo IM    Vernie Murders, PA  [facility]

## 2018-09-13 LAB — COMPREHENSIVE METABOLIC PANEL
ALT: 10 IU/L (ref 0–32)
AST: 10 IU/L (ref 0–40)
Albumin/Globulin Ratio: 1.2 (ref 1.2–2.2)
Albumin: 4.2 g/dL (ref 3.5–5.5)
Alkaline Phosphatase: 60 IU/L (ref 39–117)
BUN / CREAT RATIO: 9 (ref 9–23)
BUN: 7 mg/dL (ref 6–24)
Bilirubin Total: 0.3 mg/dL (ref 0.0–1.2)
CALCIUM: 9.2 mg/dL (ref 8.7–10.2)
CO2: 21 mmol/L (ref 20–29)
CREATININE: 0.74 mg/dL (ref 0.57–1.00)
Chloride: 99 mmol/L (ref 96–106)
GFR calc non Af Amer: 95 mL/min/{1.73_m2} (ref >59)
GFR, EST AFRICAN AMERICAN: 110 mL/min/{1.73_m2} (ref >59)
GLUCOSE: 133 mg/dL — AB (ref 65–99)
Globulin, Total: 3.5 g/dL (ref 1.5–4.5)
Potassium: 4.2 mmol/L (ref 3.5–5.2)
Sodium: 138 mmol/L (ref 134–144)
TOTAL PROTEIN: 7.7 g/dL (ref 6.0–8.5)

## 2018-09-13 LAB — CBC WITH DIFFERENTIAL/PLATELET
BASOS ABS: 0 10*3/uL (ref 0.0–0.2)
Basos: 0 %
EOS (ABSOLUTE): 0.5 10*3/uL — ABNORMAL HIGH (ref 0.0–0.4)
Eos: 9 %
Hematocrit: 33 % — ABNORMAL LOW (ref 34.0–46.6)
Hemoglobin: 10.6 g/dL — ABNORMAL LOW (ref 11.1–15.9)
IMMATURE GRANS (ABS): 0 10*3/uL (ref 0.0–0.1)
IMMATURE GRANULOCYTES: 0 %
LYMPHS: 40 %
Lymphocytes Absolute: 2.1 10*3/uL (ref 0.7–3.1)
MCH: 23.8 pg — ABNORMAL LOW (ref 26.6–33.0)
MCHC: 32.1 g/dL (ref 31.5–35.7)
MCV: 74 fL — ABNORMAL LOW (ref 79–97)
MONOCYTES: 7 %
Monocytes Absolute: 0.4 10*3/uL (ref 0.1–0.9)
NEUTROS ABS: 2.3 10*3/uL (ref 1.4–7.0)
NEUTROS PCT: 44 %
Platelets: 266 10*3/uL (ref 150–450)
RBC: 4.45 x10E6/uL (ref 3.77–5.28)
RDW: 14.7 % (ref 12.3–15.4)
WBC: 5.3 10*3/uL (ref 3.4–10.8)

## 2018-09-13 LAB — LIPID PANEL
Chol/HDL Ratio: 3.7 ratio (ref 0.0–4.4)
Cholesterol, Total: 272 mg/dL — ABNORMAL HIGH (ref 100–199)
HDL: 73 mg/dL (ref >39)
LDL CALC: 188 mg/dL — AB (ref 0–99)
Triglycerides: 55 mg/dL (ref 0–149)
VLDL CHOLESTEROL CAL: 11 mg/dL (ref 5–40)

## 2018-09-13 LAB — HEMOGLOBIN A1C
Est. average glucose Bld gHb Est-mCnc: 177 mg/dL
HEMOGLOBIN A1C: 7.8 % — AB (ref 4.8–5.6)

## 2018-09-13 LAB — TSH: TSH: 0.602 u[IU]/mL (ref 0.450–4.500)

## 2018-09-15 ENCOUNTER — Telehealth: Payer: Self-pay | Admitting: Family Medicine

## 2018-09-15 ENCOUNTER — Other Ambulatory Visit: Payer: Self-pay | Admitting: Family Medicine

## 2018-09-15 MED ORDER — METFORMIN HCL 1000 MG PO TABS: 1000.0000 mg | ORAL_TABLET | Freq: Two times a day (BID) | ORAL | 3 refills | Status: DC

## 2018-09-15 NOTE — Telephone Encounter (Signed)
The statins are covered by insurance but not these supplements.

## 2018-09-15 NOTE — Telephone Encounter (Signed)
Please advise 

## 2018-09-15 NOTE — Telephone Encounter (Signed)
Pt needing a refill on:  metFORMIN (GLUCOPHAGE) 1000 MG tablet  Please fill at:   [id number] [place], [place] - [address] [phone] (Phone) [phone] (Fax)   Thanks, [employer]

## 2018-09-15 NOTE — Telephone Encounter (Signed)
°  Pt is at the drug store.  The vitamins Q10 and Red yeast rice costs a lot. Pt is asking if this can be called in as a RX to be cheaper.  Please advise.  Thanks, [employer]

## 2018-09-16 LAB — CYTOLOGY - PAP: Diagnosis: NEGATIVE

## 2018-09-16 NOTE — Telephone Encounter (Signed)
Patient was advised. Patient stated she will just buy the otc supplements as advised.

## 2018-09-25 ENCOUNTER — Other Ambulatory Visit: Payer: Self-pay | Admitting: Family Medicine

## 2019-01-23 ENCOUNTER — Other Ambulatory Visit: Payer: Self-pay

## 2019-01-23 MED ORDER — METFORMIN HCL 1000 MG PO TABS: ORAL_TABLET | ORAL | 1 refills | Status: DC

## 2019-01-23 NOTE — Telephone Encounter (Signed)
Patient is requesting a refill on Metformin. 

## 2019-04-10 ENCOUNTER — Telehealth: Payer: Self-pay | Admitting: Family Medicine

## 2019-04-10 ENCOUNTER — Telehealth: Payer: Self-pay | Admitting: *Deleted

## 2019-04-10 DIAGNOSIS — Z20822 Contact with and (suspected) exposure to covid-19: Secondary | ICD-10-CM

## 2019-04-10 NOTE — Telephone Encounter (Signed)
Please review. Thanks!  

## 2019-04-10 NOTE — Telephone Encounter (Signed)
Pt was taking care of husband that had tested positive for Covid.  She's now having upset stomach.  She wants to get tested to see if she has Covid now.  Please call pt back at [phone] asap.  Thanks, [employer]

## 2019-04-10 NOTE — Telephone Encounter (Signed)
Please schedule for covid testing. Thanks!

## 2019-04-10 NOTE — Telephone Encounter (Signed)
Attempted to call pt to schedule testing but no answer at this time. Unable to leave voicemail message due to full mailbox.

## 2019-04-10 NOTE — Telephone Encounter (Signed)
See telephone encounter on 04/10/19. Mailbox is full and unable to leave message on voicemail.

## 2019-04-10 NOTE — Telephone Encounter (Signed)
Attempted to cal pt but no answer at this time. Unable to leave voicemail message due to full mailbox. Order placed.

## 2019-04-10 NOTE — Telephone Encounter (Signed)
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 she is essentially asymptomatic except for slight GI upset. She should be set up for [facility] home monitoring and isolated at home until results available.

## 2019-04-10 NOTE — Telephone Encounter (Signed)
Attempted to call pt x 2 but no answer. Unable to leave voicemail message due to full mailbox.

## 2019-04-13 ENCOUNTER — Other Ambulatory Visit: Payer: BC Managed Care – PPO

## 2019-04-13 DIAGNOSIS — Z20822 Contact with and (suspected) exposure to covid-19: Secondary | ICD-10-CM

## 2019-04-13 NOTE — Telephone Encounter (Signed)
Spoke with patient, scheduled her for COVID 19 test today at 1pm at [facility].  Testing protocol reviewed.

## 2019-04-13 NOTE — Telephone Encounter (Signed)
Error in last message.  App't had not been made.  I spoke with the patient and she has told me that she will be available by cell phone today.  Please call to schedule for testing.  Thank you

## 2019-04-13 NOTE — Telephone Encounter (Signed)
Patient has been scheduled through [facility]

## 2019-04-17 LAB — NOVEL CORONAVIRUS, NAA: SARS-CoV-2, NAA: NOT DETECTED

## 2019-04-20 ENCOUNTER — Telehealth: Payer: Self-pay

## 2019-04-20 NOTE — Telephone Encounter (Signed)
-----   Message from Margo Common, [place] sent at 04/19/2019  8:47 PM EDT ----- Covid-19 test is negative. 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

## 2019-04-20 NOTE — Telephone Encounter (Signed)
[email or web address] 

## 2019-04-20 NOTE — Telephone Encounter (Signed)
Pt returned missed call.  Please call pt back. ° °Thanks, °[facility] °

## 2019-04-21 NOTE — Telephone Encounter (Signed)
LMTCB ED 

## 2019-04-21 NOTE — Telephone Encounter (Signed)
Patient was advised.  

## 2019-06-16 ENCOUNTER — Telehealth: Payer: Self-pay | Admitting: Family Medicine

## 2019-06-16 NOTE — Telephone Encounter (Signed)
Patient is going to call and make the appointment

## 2019-06-16 NOTE — Telephone Encounter (Signed)
Pt called wanting to schedule an appt for her mammogram for 07-23-19.  She would like this date if possible  CB#  [phone]  [place]

## 2019-07-23 LAB — HM MAMMOGRAPHY

## 2019-07-24 ENCOUNTER — Ambulatory Visit: Payer: BC Managed Care – PPO | Admitting: Family Medicine

## 2019-07-24 ENCOUNTER — Other Ambulatory Visit: Payer: Self-pay

## 2019-07-24 ENCOUNTER — Encounter: Payer: Self-pay | Admitting: Family Medicine

## 2019-07-24 VITALS — BP 143/80 | HR 72 | Temp 96.5°F | Resp 16 | Ht 61.0 in | Wt 174.0 lb

## 2019-07-24 DIAGNOSIS — M542 Cervicalgia: Secondary | ICD-10-CM

## 2019-07-24 DIAGNOSIS — G8929 Other chronic pain: Secondary | ICD-10-CM

## 2019-07-24 DIAGNOSIS — E66811 Obesity, class 1: Secondary | ICD-10-CM

## 2019-07-24 DIAGNOSIS — E669 Obesity, unspecified: Secondary | ICD-10-CM

## 2019-07-24 DIAGNOSIS — I10 Essential (primary) hypertension: Secondary | ICD-10-CM

## 2019-07-24 DIAGNOSIS — Z23 Encounter for immunization: Secondary | ICD-10-CM | POA: Diagnosis not present

## 2019-07-24 DIAGNOSIS — E119 Type 2 diabetes mellitus without complications: Secondary | ICD-10-CM | POA: Diagnosis not present

## 2019-07-24 DIAGNOSIS — E782 Mixed hyperlipidemia: Secondary | ICD-10-CM | POA: Diagnosis not present

## 2019-07-24 DIAGNOSIS — Z1211 Encounter for screening for malignant neoplasm of colon: Secondary | ICD-10-CM

## 2019-07-24 NOTE — Progress Notes (Signed)
Patient: Cheyenne Acosta Female    DOB: 11-20-1970   48 y.o.   MRN: [id number] Visit Date: 07/24/2019  Today's Provider: Dortha Kernennis Chrismon, PA   Chief Complaint  Patient presents with  . Follow-up  . Diabetes   Subjective:     HPI    Diabetes Mellitus Type II, Follow-up:   Lab Results  Component Value Date   HGBA1C 7.8 (H) 09/12/2018   HGBA1C 7.2 (H) 04/02/2018   HGBA1C 8.4 09/13/2017   Last seen for diabetes 10 months ago.  Management since then includes; labs checked. Recommend increasing Metformin 1000 mg to one whole tablet twice a day, low fat diet, regular exercise 30-40 minutes 4 days a week. She reports fair compliance with treatment. She is not having side effects. none Current symptoms include none and have been unchanged. Home blood sugar records: fasting range: not checking  Episodes of hypoglycemia? no   Current Insulin Regimen: n/a Most Recent Eye Exam: overdue Weight trend: stable Prior visit with dietician: no Current diet: in general, a "healthy" diet   Current exercise: running/ jogging and walking  -----------------------------------------------------------   Hypertension, follow-up:  BP Readings from Last 3 Encounters:  07/24/19 (!) 143/80  09/12/18 130/64  06/18/18 130/70    She was last seen for hypertension 10 months ago.  BP at that visit was 130/64. Management since that visit includes; labs checked, no changes made.She reports good compliance with treatment. She is not having side effects. none She is exercising. She is adherent to low salt diet.   Outside blood pressures are normal. She is experiencing none.  Patient denies chest pain, chest pressure/discomfort, claudication, dyspnea, exertional chest pressure/discomfort, fatigue, irregular heart beat, lower extremity edema, near-syncope, orthopnea, palpitations, paroxysmal nocturnal dyspnea, syncope and tachypnea.   Cardiovascular risk factors include diabetes mellitus,  dyslipidemia and hypertension.  Use of agents associated with hypertension: none.   -----------------------------------------------------------    Lipid/Cholesterol, Follow-up:   Last seen for this 10 months ago.  Management since that visit includes; labs checked. Advised patient she should add Pravastatin 20 mg qd or Red Yeast Rice with Co-Q 10 qd. Recheck progress in 3 months. Patient stated she would try Red Yeast Rice with Co-Q 10 qd.  Last Lipid Panel:    Component Value Date/Time   CHOL 272 (H) 09/12/2018 1217   TRIG 55 09/12/2018 1217   HDL 73 09/12/2018 1217   CHOLHDL 3.7 09/12/2018 1217   LDLCALC 188 (H) 09/12/2018 1217   She reports good compliance with treatment. She is not having side effects. none  Wt Readings from Last 3 Encounters:  07/24/19 174 lb (78.9 kg)  09/12/18 187 lb (84.8 kg)  06/18/18 182 lb (82.6 kg)    -----------------------------------------------------------    Allergies  Allergen Reactions  . Aspirin   . Latex   . Sulfa Antibiotics Rash     Current Outpatient Medications:  .  ibuprofen (ADVIL,MOTRIN) 600 MG tablet, Take 1 tablet (600 mg total) by mouth every 8 (eight) hours as needed., Disp: 30 tablet, Rfl: 0 .  metFORMIN (GLUCOPHAGE) 1000 MG tablet, TAKE 1/2 TABLET BY MOUTH EACH EVENING AND TAKE 1 TABLET BY MOUTH EACH MORNING, Disp: 135 tablet, Rfl: 1 .  Omega-3 Fatty Acids (FISH OIL) 1200 MG CAPS, Take by mouth., Disp: , Rfl:  .  diazepam (VALIUM) 5 MG tablet, Take 1 tablet (5 mg total) by mouth every 12 (twelve) hours as needed for anxiety. (Patient not taking: Reported on 03/28/2018), Disp: 30  tablet, Rfl: 0 .  methocarbamol (ROBAXIN) 750 MG tablet, Take 1 tablet (750 mg total) by mouth every 6 (six) hours as needed for muscle spasms. (Patient not taking: Reported on 07/24/2019), Disp: 40 tablet, Rfl: 2 .  ramipril (ALTACE) 5 MG capsule, Take 1 capsule (5 mg total) by mouth daily. (Patient not taking: Reported on 07/24/2019), Disp: 90  capsule, Rfl: 3  Review of Systems  Constitutional: Negative for appetite change, chills, fatigue and fever.  Respiratory: Negative for chest tightness and shortness of breath.   Cardiovascular: Negative for chest pain and palpitations.  Gastrointestinal: Negative for abdominal pain, nausea and vomiting.  Neurological: Negative for dizziness and weakness.    Social History   Tobacco Use  . Smoking status: Never Smoker  . Smokeless tobacco: Never Used  Substance Use Topics  . Alcohol use: No    Alcohol/week: 0.0 standard drinks     Objective:   BP (!) 143/80 (BP Location: Right Arm, Patient Position: Sitting, Cuff Size: Large)   Pulse 72   Temp (!) 96.5 F (35.8 C) (Other (Comment))   Resp 16   Ht 5\' 1"  (1.549 m)   Wt 174 lb (78.9 kg)   LMP 07/24/2019   SpO2 99%   BMI 32.88 kg/m  Vitals:   07/24/19 1005  BP: (!) 143/80  Pulse: 72  Resp: 16  Temp: (!) 96.5 F (35.8 C)  TempSrc: Other (Comment)  SpO2: 99%  Weight: 174 lb (78.9 kg)  Height: 5\' 1"  (1.549 m)  Body mass index is 32.88 kg/m.  Physical Exam Constitutional:      General: She is not in acute distress.    Appearance: She is well-developed.  HENT:     Head: Normocephalic and atraumatic.     Right Ear: Hearing, tympanic membrane and external ear normal.     Left Ear: Hearing, tympanic membrane and external ear normal.     Nose: Nose normal.     Mouth/Throat:     Pharynx: Oropharynx is clear.  Eyes:     General: Lids are normal. No scleral icterus.       Right eye: No discharge.        Left eye: No discharge.     Conjunctiva/sclera: Conjunctivae normal.  Neck:     Musculoskeletal: Neck supple.  Cardiovascular:     Rate and Rhythm: Normal rate and regular rhythm.     Heart sounds: Normal heart sounds.  Pulmonary:     Effort: Pulmonary effort is normal. No respiratory distress.     Breath sounds: Normal breath sounds.  Abdominal:     General: Bowel sounds are normal.     Palpations: Abdomen is  soft.  Genitourinary:    Comments: Accomplished by GYN with PAP and mammograms on 07-23-19. Musculoskeletal: Normal range of motion.  Skin:    Findings: No lesion or rash.  Neurological:     Mental Status: She is alert and oriented to person, place, and time.  Psychiatric:        Speech: Speech normal.        Behavior: Behavior normal.        Thought Content: Thought content normal.       Assessment & Plan    1. Type 2 diabetes mellitus without complication, without long-term current use of insulin (HCC) Still tolerating the Metformin 1000 mg 1/2 tablet each evening and 1 whole tablet each morning. Encouraged to continue diabetic diet, weight loss efforts and regular exercise. Foot exam normal without  complaints of neuropathy, polyuria, polyphagia or vision disturbance. Recheck routine follow up labs and recheck pending reports. - CBC with Differential/Platelet - Comprehensive metabolic panel - Hemoglobin A1c - Lipid panel - TSH  2. Mixed hyperlipidemia Still taking Omega-3 1200 mg BID and working out with 13 lb weight loss since December 2019. Recheck labs and follow up pending reports. - CBC with Differential/Platelet - Comprehensive metabolic panel - Lipid panel - TSH  3. Colon cancer screening Attempted colonoscopy  05-25-18 by Dr. Earnie Larsson at [facility] ([place] Could not accomplish due to poor prep and patient postponed second attempt until now due to COVID pandemic restrictions. - Ambulatory referral to Gastroenterology  4. Needs flu shot - Flu Vaccine QUAD 36+ mos IM  5. Chronic neck pain Intermittent aches, spasms and pains in neck when standing at a computer/desk or OR table for long periods of time. Does not feel the Methocarbamol or Valium helps much. Gets more relief out of the rest and moist heat applications with massage.  6. Essential (primary) hypertension Fair control of BP states she is not taking the Ramipril now. Recheck CBC, CMP, TSH and Lipid Panel to assess  cardiovascular risk. - CBC with Differential/Platelet - Comprehensive metabolic panel - Lipid panel - TSH  7. Obesity (BMI 30.0-34.9) Has been working out by some weight lifting, walking, hiking and running 3-4 days a week. Will recheck routine labs and encouraged to continue this plan. - CBC with Differential/Platelet - Comprehensive metabolic panel - Hemoglobin A1c - Lipid panel - TSH     Vernie Murders, PA  [facility]

## 2019-07-25 LAB — LIPID PANEL
Chol/HDL Ratio: 3.4 ratio (ref 0.0–4.4)
Cholesterol, Total: 258 mg/dL — ABNORMAL HIGH (ref 100–199)
HDL: 76 mg/dL (ref >39)
LDL Chol Calc (NIH): 175 mg/dL — ABNORMAL HIGH (ref 0–99)
Triglycerides: 48 mg/dL (ref 0–149)
VLDL Cholesterol Cal: 7 mg/dL (ref 5–40)

## 2019-07-25 LAB — COMPREHENSIVE METABOLIC PANEL
ALT: 8 IU/L (ref 0–32)
AST: 12 IU/L (ref 0–40)
Albumin/Globulin Ratio: 1.3 (ref 1.2–2.2)
Albumin: 4.3 g/dL (ref 3.8–4.8)
Alkaline Phosphatase: 59 IU/L (ref 39–117)
BUN/Creatinine Ratio: 14 (ref 9–23)
BUN: 10 mg/dL (ref 6–24)
Bilirubin Total: 0.3 mg/dL (ref 0.0–1.2)
CO2: 22 mmol/L (ref 20–29)
Calcium: 9.3 mg/dL (ref 8.7–10.2)
Chloride: 101 mmol/L (ref 96–106)
Creatinine, Ser: 0.74 mg/dL (ref 0.57–1.00)
GFR calc Af Amer: 109 mL/min/{1.73_m2} (ref >59)
GFR calc non Af Amer: 95 mL/min/{1.73_m2} (ref >59)
Globulin, Total: 3.2 g/dL (ref 1.5–4.5)
Glucose: 121 mg/dL — ABNORMAL HIGH (ref 65–99)
Potassium: 4.2 mmol/L (ref 3.5–5.2)
Sodium: 136 mmol/L (ref 134–144)
Total Protein: 7.5 g/dL (ref 6.0–8.5)

## 2019-07-25 LAB — CBC WITH DIFFERENTIAL/PLATELET
Basophils Absolute: 0 10*3/uL (ref 0.0–0.2)
Basos: 1 %
EOS (ABSOLUTE): 0.3 10*3/uL (ref 0.0–0.4)
Eos: 5 %
Hematocrit: 37.5 % (ref 34.0–46.6)
Hemoglobin: 12.4 g/dL (ref 11.1–15.9)
Immature Grans (Abs): 0 10*3/uL (ref 0.0–0.1)
Immature Granulocytes: 0 %
Lymphocytes Absolute: 2.4 10*3/uL (ref 0.7–3.1)
Lymphs: 44 %
MCH: 28.1 pg (ref 26.6–33.0)
MCHC: 33.1 g/dL (ref 31.5–35.7)
MCV: 85 fL (ref 79–97)
Monocytes Absolute: 0.4 10*3/uL (ref 0.1–0.9)
Monocytes: 7 %
Neutrophils Absolute: 2.3 10*3/uL (ref 1.4–7.0)
Neutrophils: 43 %
Platelets: 217 10*3/uL (ref 150–450)
RBC: 4.42 x10E6/uL (ref 3.77–5.28)
RDW: 12.9 % (ref 11.7–15.4)
WBC: 5.4 10*3/uL (ref 3.4–10.8)

## 2019-07-25 LAB — HEMOGLOBIN A1C
Est. average glucose Bld gHb Est-mCnc: 140 mg/dL
Hgb A1c MFr Bld: 6.5 % — ABNORMAL HIGH (ref 4.8–5.6)

## 2019-07-25 LAB — TSH: TSH: 0.418 u[IU]/mL — ABNORMAL LOW (ref 0.450–4.500)

## 2019-08-04 ENCOUNTER — Other Ambulatory Visit: Payer: Self-pay | Admitting: Family Medicine

## 2019-08-22 ENCOUNTER — Encounter: Payer: Self-pay | Admitting: Family Medicine

## 2019-08-24 ENCOUNTER — Other Ambulatory Visit: Payer: Self-pay | Admitting: Family Medicine

## 2019-08-24 DIAGNOSIS — G8929 Other chronic pain: Secondary | ICD-10-CM

## 2019-08-24 DIAGNOSIS — M542 Cervicalgia: Secondary | ICD-10-CM

## 2019-08-24 MED ORDER — DIAZEPAM 5 MG PO TABS: ORAL_TABLET | ORAL | 0 refills | Status: DC

## 2020-03-10 NOTE — Progress Notes (Signed)
Complete physical exam   Patient: Cheyenne Acosta   DOB: 1971-05-04   49 y.o. Female  MRN: [id number] Visit Date: 03/11/2020  Today's healthcare provider: Vernie Murders, PA   Chief Complaint  Patient presents with  . Annual Exam   [employer] as a Education administrator for [employer], PA.,have documented all relevant documentation on the behalf of [employer], PA,as directed by  [employer], PA while in the presence of [employer], [place].  Subjective    Cheyenne Acosta is a 49 y.o. female who presents today for a complete physical exam.  She reports consuming a general diet. Home exercise routine includes walking 1 hrs per day. She generally feels well. She reports sleeping well. She does not have additional problems to discuss today.    Patient Active Problem List   Diagnosis Date Noted  . Chronic neck pain 11/22/2016  . Absence of menstruation 09/14/2015  . Essential (primary) hypertension 09/14/2015  . Adjustment disorder with anxiety 09/14/2015  . Advanced maternal age in multigravida 08/12/2014  . H/O previous obstetrical problem 08/12/2014  . Esophagitis, reflux 07/22/2006  . Diabetes ([place]) 07/15/2006  . HLD (hyperlipidemia) 07/15/2006   Past Surgical History:  Procedure Laterality Date  . CESAREAN SECTION     Social History   Socioeconomic History  . Marital status: Married    Spouse name: Not on file  . Number of children: Not on file  . Years of education: Not on file  . Highest education level: Not on file  Occupational History  . Not on file  Tobacco Use  . Smoking status: Never Smoker  . Smokeless tobacco: Never Used  Substance and Sexual Activity  . Alcohol use: No    Alcohol/week: 0.0 standard drinks  . Drug use: No  . Sexual activity: Not on file  Other Topics Concern  . Not on file  Social History Narrative  . Not on file   Social Determinants of Health   Financial Resource Strain:   . Difficulty of Paying Living  Expenses:   Food Insecurity:   . Worried About Charity fundraiser in the Last Year:   . Arboriculturist in the Last Year:   Transportation Needs:   . Film/video editor (Medical):   Marland Kitchen Lack of Transportation (Non-Medical):   Physical Activity:   . Days of Exercise per Week:   . Minutes of Exercise per Session:   Stress:   . Feeling of Stress :   Social Connections:   . Frequency of Communication with Friends and Family:   . Frequency of Social Gatherings with Friends and Family:   . Attends Religious Services:   . Active Member of Clubs or Organizations:   . Attends Archivist Meetings:   Marland Kitchen Marital Status:   Intimate Partner Violence:   . Fear of Current or Ex-Partner:   . Emotionally Abused:   Marland Kitchen Physically Abused:   . Sexually Abused:    Family Status  Relation Name Status  . Mother  Deceased at age 22  . Father  Deceased at age 58  . Son  Alive  . PGM  Deceased at age 52   Family History  Problem Relation Age of Onset  . Diabetes Mother   . Heart attack Mother   . Prostate cancer Father   . Hypertension Father   . Healthy Son    Allergies  Allergen Reactions  . Aspirin   . Latex   . Sulfa  Antibiotics Rash    Patient Care Team: Burlon Centrella, Vickki Muff, PA as PCP - General (Family Medicine)   Medications: Outpatient Medications Prior to Visit  Medication Sig  . diazepam (VALIUM) 5 MG tablet Take 1 tablet by mouth every 12 hours as needed for muscle spasms.  . metFORMIN (GLUCOPHAGE) 1000 MG tablet TAKE 1 TABLET BY MOUTH IN THE MORNING AND ONE-HALF TABLET IN THE EVENING  . Omega-3 Fatty Acids (FISH OIL) 1200 MG CAPS Take by mouth.  . [DISCONTINUED] ibuprofen (ADVIL,MOTRIN) 600 MG tablet Take 1 tablet (600 mg total) by mouth every 8 (eight) hours as needed.   No facility-administered medications prior to visit.    Review of Systems  Constitutional: Negative.   HENT: Negative.   Eyes: Negative.   Respiratory: Negative.   Cardiovascular: Negative.    Gastrointestinal: Negative.   Endocrine: Negative.   Genitourinary: Negative.   Musculoskeletal: Negative.   Skin: Negative.   Allergic/Immunologic: Negative.   Neurological: Negative.   Hematological: Negative.   Psychiatric/Behavioral: Negative.       Objective       Physical Exam  BP (!) 143/84 (BP Location: Right Arm, Patient Position: Sitting, Cuff Size: Normal)   Pulse 79   Temp (!) 96.6 F (35.9 C) (Temporal)   Ht 5' 1"  (1.549 m)   Wt 182 lb 9.6 oz (82.8 kg)   BMI 34.50 kg/m   General Appearance:    Alert, cooperative, no distress, appears stated age  Head:    Normocephalic, without obvious abnormality, atraumatic  Eyes:    PERRL, conjunctiva/corneas clear, EOM's intact, fundi    benign, both eyes  Ears:    Normal TM's and external ear canals, both ears  Nose:   Nares normal, septum midline, mucosa normal, no drainage    or sinus tenderness  Throat:   Lips, mucosa, and tongue normal; teeth and gums normal  Neck:   Supple, symmetrical, trachea midline, no adenopathy;    thyroid:  no enlargement/tenderness/nodules; no carotid   bruit or JVD  Back:     Symmetric, no curvature, ROM normal, no CVA tenderness  Lungs:     Clear to auscultation bilaterally, respirations unlabored  Chest Wall:    No tenderness or deformity   Heart:    Regular rate and rhythm, S1 and S2 normal, no murmur, rub   or gallop  Breast Exam:    Declined exam. Will get mammograms and PAP with GYN by this Fall.  Abdomen:     Soft, non-tender, bowel sounds active all four quadrants,    no masses, no organomegaly  Genitalia:    Deferred to GYN  Extremities:   Extremities normal, atraumatic, no cyanosis or edema  Pulses:   2+ and symmetric all extremities  Skin:   Skin color, texture, turgor normal, no rashes or lesions  Lymph nodes:   Cervical, supraclavicular, and axillary nodes normal  Neurologic:   CNII-XII intact, normal strength, sensation and reflexes    throughout     Depression  Screen  PHQ 2/9 Scores 03/11/2020 09/12/2018 09/13/2017  PHQ - 2 Score 0 0 0  PHQ- 9 Score - 0 0    No results found for any visits on 03/11/20.  Assessment & Plan    Routine Health Maintenance and Physical Exam  Exercise Activities and Dietary recommendations Goals   Encouraged to work on weight loss with low fat diabetic diet and 30-40 minutes of exercise 3-4 days a week.     Immunization History  Administered  Date(s) Administered  . DTaP 01/06/1969, 02/10/1969, 09/07/1970, 03/23/1974  . Hepatitis B 05/30/1999  . IPV 01/06/1969, 02/10/1969, 09/07/1970, 03/23/1974  . Influenza Split 08/12/2012  . Influenza,inj,Quad PF,6+ Mos 06/21/2016, 07/24/2019  . Influenza-Unspecified 08/15/2017, 07/01/2018  . MMR 05/26/1974, 05/30/1999  . PFIZER SARS-COV-2 Vaccination 10/23/2019, 11/13/2019  . Tdap 09/12/2018    Health Maintenance  Topic Date Due  . Hepatitis C Screening  Never done  . PNEUMOCOCCAL POLYSACCHARIDE VACCINE AGE 4-64 HIGH RISK  Never done  . OPHTHALMOLOGY EXAM  02/21/2018  . MAMMOGRAM  07/12/2018  . FOOT EXAM  09/13/2018  . URINE MICROALBUMIN  09/13/2018  . HEMOGLOBIN A1C  01/22/2020  . INFLUENZA VACCINE  05/01/2020  . PAP SMEAR-Modifier  09/12/2021  . COLONOSCOPY  05/26/2028  . TETANUS/TDAP  09/12/2028  . COVID-19 Vaccine  Completed  . HIV Screening  Completed    Discussed health benefits of physical activity, and encouraged her to engage in regular exercise appropriate for her age and condition.  1. Annual physical exam Good general health. Last colonoscopy (05-26-18) to be rescheduled this year due to poor prep. Declines BMD. Recommend taking Calcium 1200 mg and Vitamin-D 800 IU daily to maintain bone density. Immunizations up to date. Given anticipatory counseling.  2. Type 2 diabetes mellitus without complication, without long-term current use of insulin (HCC) Feeling well without hypoglycemic symptoms. Does not check FBS at home. Tolerating Metformin 1000 mg  1 tablet each morning and 1/2 tablet each evening. No peripheral neuropathy symptoms. Check follow up labs. Declines ACE and statin. - HgB A1c - Microalbumin, urine  3. Mixed hyperlipidemia Working on low fat diet and taking Omega-3 Fish Oil 1200 mg qd. Check Lipid Panel and TSH.  - Lipid Profile - TSH  4. Encounter for screening mammogram for malignant neoplasm of breast Deferred exam and will get mammograms with GYN.  5. Need for hepatitis C screening test Declines test today. Thinks it was done at her place of employment [facility]).  6. Essential (primary) hypertension Well controlled with restriction of salt and caffeine intake. Encouraged to work on weight loss and exercise regularly. Declines additional medications. Recheck labs. - CBC with Differential - Comprehensive Metabolic Panel (CMET) - TSH   No follow-ups on file.     Andres Shad, PA, have reviewed all documentation for this visit. The documentation on 03/11/20 for the exam, diagnosis, procedures, and orders are all accurate and complete.    Vernie Murders, [place] [phone] (phone) [phone] (fax)  [place]

## 2020-03-11 ENCOUNTER — Ambulatory Visit (INDEPENDENT_AMBULATORY_CARE_PROVIDER_SITE_OTHER): Payer: Commercial Managed Care - PPO | Admitting: Family Medicine

## 2020-03-11 ENCOUNTER — Encounter: Payer: Self-pay | Admitting: Family Medicine

## 2020-03-11 ENCOUNTER — Other Ambulatory Visit: Payer: Self-pay

## 2020-03-11 VITALS — BP 143/84 | HR 79 | Temp 96.6°F | Ht 61.0 in | Wt 182.6 lb

## 2020-03-11 DIAGNOSIS — Z1231 Encounter for screening mammogram for malignant neoplasm of breast: Secondary | ICD-10-CM | POA: Diagnosis not present

## 2020-03-11 DIAGNOSIS — E119 Type 2 diabetes mellitus without complications: Secondary | ICD-10-CM

## 2020-03-11 DIAGNOSIS — E782 Mixed hyperlipidemia: Secondary | ICD-10-CM | POA: Diagnosis not present

## 2020-03-11 DIAGNOSIS — Z0000 Encounter for general adult medical examination without abnormal findings: Secondary | ICD-10-CM

## 2020-03-11 DIAGNOSIS — I10 Essential (primary) hypertension: Secondary | ICD-10-CM

## 2020-03-11 DIAGNOSIS — Z1159 Encounter for screening for other viral diseases: Secondary | ICD-10-CM

## 2020-03-12 LAB — COMPREHENSIVE METABOLIC PANEL
ALT: 18 IU/L (ref 0–32)
AST: 15 IU/L (ref 0–40)
Albumin/Globulin Ratio: 1.3 (ref 1.2–2.2)
Albumin: 4.1 g/dL (ref 3.8–4.9)
Alkaline Phosphatase: 51 IU/L (ref 48–121)
BUN/Creatinine Ratio: 9 (ref 9–23)
BUN: 7 mg/dL (ref 6–24)
Bilirubin Total: 0.3 mg/dL (ref 0.0–1.2)
CO2: 22 mmol/L (ref 20–29)
Calcium: 9 mg/dL (ref 8.7–10.2)
Chloride: 103 mmol/L (ref 96–106)
Creatinine, Ser: 0.8 mg/dL (ref 0.57–1.00)
GFR calc Af Amer: 99 mL/min/{1.73_m2} (ref >59)
GFR calc non Af Amer: 86 mL/min/{1.73_m2} (ref >59)
Globulin, Total: 3.2 g/dL (ref 1.5–4.5)
Glucose: 106 mg/dL — ABNORMAL HIGH (ref 65–99)
Potassium: 4.1 mmol/L (ref 3.5–5.2)
Sodium: 137 mmol/L (ref 134–144)
Total Protein: 7.3 g/dL (ref 6.0–8.5)

## 2020-03-12 LAB — CBC WITH DIFFERENTIAL/PLATELET
Basophils Absolute: 0 10*3/uL (ref 0.0–0.2)
Basos: 1 %
EOS (ABSOLUTE): 0.2 10*3/uL (ref 0.0–0.4)
Eos: 4 %
Hematocrit: 30.4 % — ABNORMAL LOW (ref 34.0–46.6)
Hemoglobin: 9.1 g/dL — ABNORMAL LOW (ref 11.1–15.9)
Immature Grans (Abs): 0 10*3/uL (ref 0.0–0.1)
Immature Granulocytes: 0 %
Lymphocytes Absolute: 1.8 10*3/uL (ref 0.7–3.1)
Lymphs: 34 %
MCH: 22.8 pg — ABNORMAL LOW (ref 26.6–33.0)
MCHC: 29.9 g/dL — ABNORMAL LOW (ref 31.5–35.7)
MCV: 76 fL — ABNORMAL LOW (ref 79–97)
Monocytes Absolute: 0.4 10*3/uL (ref 0.1–0.9)
Monocytes: 7 %
Neutrophils Absolute: 2.9 10*3/uL (ref 1.4–7.0)
Neutrophils: 54 %
Platelets: 242 10*3/uL (ref 150–450)
RBC: 4 x10E6/uL (ref 3.77–5.28)
RDW: 15 % (ref 11.7–15.4)
WBC: 5.3 10*3/uL (ref 3.4–10.8)

## 2020-03-12 LAB — MICROALBUMIN, URINE: Microalbumin, Urine: 10.4 ug/mL

## 2020-03-12 LAB — LIPID PANEL
Chol/HDL Ratio: 3.1 ratio (ref 0.0–4.4)
Cholesterol, Total: 238 mg/dL — ABNORMAL HIGH (ref 100–199)
HDL: 78 mg/dL (ref >39)
LDL Chol Calc (NIH): 151 mg/dL — ABNORMAL HIGH (ref 0–99)
Triglycerides: 56 mg/dL (ref 0–149)
VLDL Cholesterol Cal: 9 mg/dL (ref 5–40)

## 2020-03-12 LAB — TSH: TSH: 0.679 u[IU]/mL (ref 0.450–4.500)

## 2020-03-12 LAB — HEMOGLOBIN A1C
Est. average glucose Bld gHb Est-mCnc: 143 mg/dL
Hgb A1c MFr Bld: 6.6 % — ABNORMAL HIGH (ref 4.8–5.6)

## 2020-03-15 ENCOUNTER — Telehealth: Payer: Self-pay

## 2020-03-15 NOTE — Telephone Encounter (Signed)
Could try OTC Red Yeast Rice with Co-Q 10 each evening or a prescription of Zetia or Welchol.

## 2020-03-15 NOTE — Telephone Encounter (Signed)
-----   Message from Tamsen Roers, [place] sent at 03/14/2020  5:18 PM EDT ----- Hgb A1C is essentially the same as in October 2020. Sugar and kidney function in good shape. LDL cholesterol is still elevated and with diabetes present, should reduce cardiovascular event risk by starting a statin Atorvastatin 20 mg qd. Hemoglobin, hematocrit and indices (MCV, MCH, MCHC) low indicating iron deficiency anemia or anemia of blood loss. Will ask lab to run total iron with IBC and recommend coming by the office to pick up an OC-Light test to check stools for blood in bowels.

## 2020-03-15 NOTE — Telephone Encounter (Signed)
NA, mailbox is full. 

## 2020-03-15 NOTE — Telephone Encounter (Signed)
Patient was advised and seen results via MyChart. Patient states she can not take statin drugs due to it causing body aches/ pain. She would like to know if something else could be prescribed for her cholesterol. Please advise.

## 2020-03-16 NOTE — Telephone Encounter (Signed)
Patient is willing to try Welchol, please advise on dose.

## 2020-03-16 NOTE — Telephone Encounter (Signed)
NA. Mail box is full 

## 2020-03-17 ENCOUNTER — Other Ambulatory Visit: Payer: Self-pay | Admitting: Family Medicine

## 2020-03-17 DIAGNOSIS — E782 Mixed hyperlipidemia: Secondary | ICD-10-CM

## 2020-03-17 MED ORDER — COLESEVELAM HCL 625 MG PO TABS: 1250.0000 mg | ORAL_TABLET | Freq: Two times a day (BID) | ORAL | 3 refills | Status: DC

## 2020-03-17 NOTE — Telephone Encounter (Signed)
Sent prescription to her pharmacy in [place].

## 2020-05-23 ENCOUNTER — Telehealth: Payer: Self-pay

## 2020-05-23 DIAGNOSIS — Z1211 Encounter for screening for malignant neoplasm of colon: Secondary | ICD-10-CM

## 2020-05-23 NOTE — Telephone Encounter (Signed)
Is it ok for me to go ahead and order a cologuard for this patient.  She had poor prep on her colonoscopy and was advised to repeat in 6 months.  That was in 05/2018.  Patient states she would prefer to try this first.

## 2020-05-23 NOTE — Telephone Encounter (Signed)
Copied from [place] [id number]. Topic: General - Inquiry >> May 23, 2020 11:39 AM Gillis Ends D wrote: Reason for CRM: Patient wants a Colorguard kit to be sent to her house. Please advise

## 2020-05-23 NOTE — Addendum Note (Signed)
Addended by: Malva Limes on: 05/23/2020 04:08 PM   Modules accepted: Orders

## 2020-05-23 NOTE — Telephone Encounter (Signed)
Have placed order for cologuard

## 2020-05-23 NOTE — Telephone Encounter (Signed)
LMTCB, would like to discuss why this is needed

## 2020-07-13 ENCOUNTER — Ambulatory Visit: Payer: Self-pay

## 2020-07-13 NOTE — Telephone Encounter (Signed)
Patient called statin that she keeps having pain rt chest just under her breast every time she eats.  She states it will come on when taking a deep breath.  She has been taking pepto for the pain and then it will go away.  She has never been dx with reflux. She denies sour taste in her mouth with this pain. The pain began yesterday.She states that she has not vomited.  She has no cough. She rates the pain 5-6 and states that it will last 20 minutes until she can get the pepto. She has Hx of diabetes and high cholesterol. She is not taking a statin because they cause her pain. She was taking red yeast rice and CoQ10 but has stopped. Deep breaths bring on pain. Per protocol patient was asked to go to ER for evaluation.  She refused stating that she will go to UC.   Reason for Disposition . Taking a deep breath makes pain worse  Answer Assessment - Initial Assessment Questions 1. LOCATION: "Where does it hurt?"       When breaths in rt did under rt breast 2. RADIATION: "Does the pain go anywhere else?" (e.g., into neck, jaw, arms, back)    no 3. ONSET: "When did the chest pain begin?" (Minutes, hours or days)     yesterday 4. PATTERN "Does the pain come and go, or has it been constant since it started?"  "Does it get worse with exertion?"     Comes and goes after eat 5. DURATION: "How long does it last" (e.g., seconds, minutes, hours)     20 minutes 6. SEVERITY: "How bad is the pain?"  (e.g., Scale 1-10; mild, moderate, or severe)    - MILD (1-3): doesn't interfere with normal activities     - MODERATE (4-7): interferes with normal activities or awakens from sleep    - SEVERE (8-10): excruciating pain, unable to do any normal activities       5-6 7. CARDIAC RISK FACTORS: "Do you have any history of heart problems or risk factors for heart disease?" (e.g., angina, prior heart attack; diabetes, high blood pressure, high cholesterol, smoker, or strong family history of heart disease)     Diabetic,  high cholesterol 8. PULMONARY RISK FACTORS: "Do you have any history of lung disease?"  (e.g., blood clots in lung, asthma, emphysema, birth control pills)     no 9. CAUSE: "What do you think is causing the chest pain?"     Acid reflux 10. OTHER SYMPTOMS: "Do you have any other symptoms?" (e.g., dizziness, nausea, vomiting, sweating, fever, difficulty breathing, cough)       Deep breath give her pain 11. PREGNANCY: "Is there any chance you are pregnant?" "When was your last menstrual period?"       Last period september  Protocols used: CHEST PAIN-A-AH

## 2020-07-15 ENCOUNTER — Encounter: Payer: Self-pay | Admitting: Family Medicine

## 2020-07-18 NOTE — Telephone Encounter (Unsigned)
Copied from CRM [id number]. Topic: Referral - Request for Referral >> Jul 14, 2020 12:51 PM Dalphine Handing A wrote: Patient is wanting a referral placed to [facility] to cardiology for an ekg for Oct 22. Please advise

## 2020-07-21 ENCOUNTER — Other Ambulatory Visit: Payer: Self-pay | Admitting: Family Medicine

## 2020-07-21 DIAGNOSIS — R071 Chest pain on breathing: Secondary | ICD-10-CM

## 2020-07-21 LAB — COLOGUARD: COLOGUARD: NEGATIVE

## 2020-07-22 ENCOUNTER — Other Ambulatory Visit: Payer: Self-pay

## 2020-07-22 ENCOUNTER — Ambulatory Visit: Payer: Commercial Managed Care - PPO | Admitting: Family Medicine

## 2020-07-22 ENCOUNTER — Encounter: Payer: Self-pay | Admitting: Family Medicine

## 2020-07-22 VITALS — BP 141/67 | HR 88 | Temp 98.5°F | Resp 16 | Ht 61.0 in | Wt 187.0 lb

## 2020-07-22 DIAGNOSIS — E782 Mixed hyperlipidemia: Secondary | ICD-10-CM

## 2020-07-22 DIAGNOSIS — E119 Type 2 diabetes mellitus without complications: Secondary | ICD-10-CM

## 2020-07-22 DIAGNOSIS — I10 Essential (primary) hypertension: Secondary | ICD-10-CM

## 2020-07-22 DIAGNOSIS — Z23 Encounter for immunization: Secondary | ICD-10-CM | POA: Diagnosis not present

## 2020-07-22 DIAGNOSIS — R071 Chest pain on breathing: Secondary | ICD-10-CM | POA: Diagnosis not present

## 2020-07-22 LAB — POCT GLYCOSYLATED HEMOGLOBIN (HGB A1C)
Est. average glucose Bld gHb Est-mCnc: 146
Hemoglobin A1C: 6.7 % — AB (ref 4.0–5.6)

## 2020-07-22 MED ORDER — METFORMIN HCL 1000 MG PO TABS: ORAL_TABLET | ORAL | 3 refills | Status: DC

## 2020-07-22 MED ORDER — COLESEVELAM HCL 625 MG PO TABS: 1250.0000 mg | ORAL_TABLET | Freq: Two times a day (BID) | ORAL | 3 refills | Status: DC

## 2020-07-22 NOTE — Progress Notes (Signed)
I,April Miller,acting as a Neurosurgeon for [employer]have documented all relevant documentation on the behalf of [employer],as directed by  [employer] while in the presence of [employer], [place].   Established patient visit   Patient: Cheyenne Acosta   DOB: 1971/06/02   49 y.o. Female  MRN: [id number] Visit Date: 07/22/2020  Today's healthcare provider: Dortha Kern, PA   Chief Complaint  Patient presents with  . Diabetes  . Follow-up   Subjective    HPI  Diabetes Mellitus Type II, follow-up  Lab Results  Component Value Date   HGBA1C 6.7 (A) 07/22/2020   HGBA1C 6.6 (H) 03/11/2020   HGBA1C 6.5 (H) 07/24/2019   Last seen for diabetes 4 months ago.  Management since then includes; started Welchol. She reports good compliance with treatment. She is not having side effects.   Home blood sugar records: not testing  Episodes of hypoglycemia? No    Current insulin regiment: None Most Recent Eye Exam: Scheduled for eye exam in a month  --------------------------------------------------------------------  History reviewed. No pertinent past medical history.   Past Surgical History:  Procedure Laterality Date  . CESAREAN SECTION     Social History   Tobacco Use  . Smoking status: Never Smoker  . Smokeless tobacco: Never Used  Substance Use Topics  . Alcohol use: No    Alcohol/week: 0.0 standard drinks  . Drug use: No   Family Status  Relation Name Status  . Mother  Deceased at age 65  . Father  Deceased at age 32  . Son  Alive  . PGM  Deceased at age 37   Allergies  Allergen Reactions  . Aspirin   . Latex   . Sulfa Antibiotics Rash       Medications: Outpatient Medications Prior to Visit  Medication Sig  . colesevelam (WELCHOL) 625 MG tablet Take 2 tablets (1,250 mg total) by mouth 2 (two) times daily with a meal.  . diazepam (VALIUM) 5 MG tablet Take 1 tablet by mouth every 12 hours as needed for muscle spasms.  .  metFORMIN (GLUCOPHAGE) 1000 MG tablet TAKE 1 TABLET BY MOUTH IN THE MORNING AND ONE-HALF TABLET IN THE EVENING  . Omega-3 Fatty Acids (FISH OIL) 1200 MG CAPS Take by mouth.   No facility-administered medications prior to visit.    Review of Systems  Constitutional: Negative for appetite change, chills, fatigue and fever.  Respiratory: Negative for chest tightness and shortness of breath.   Cardiovascular: Negative for chest pain and palpitations.  Gastrointestinal: Negative for abdominal pain, nausea and vomiting.  Neurological: Negative for dizziness and weakness.    Last CBC Lab Results  Component Value Date   WBC 5.3 03/11/2020   HGB 9.1 (L) 03/11/2020   HCT 30.4 (L) 03/11/2020   MCV 76 (L) 03/11/2020   MCH 22.8 (L) 03/11/2020   RDW 15.0 03/11/2020   PLT 242 03/11/2020   Last metabolic panel Lab Results  Component Value Date   GLUCOSE 106 (H) 03/11/2020   NA 137 03/11/2020   K 4.1 03/11/2020   CL 103 03/11/2020   CO2 22 03/11/2020   BUN 7 03/11/2020   CREATININE 0.80 03/11/2020   GFRNONAA 86 03/11/2020   GFRAA 99 03/11/2020   CALCIUM 9.0 03/11/2020   PROT 7.3 03/11/2020   ALBUMIN 4.1 03/11/2020   LABGLOB 3.2 03/11/2020   AGRATIO 1.3 03/11/2020   BILITOT 0.3 03/11/2020   ALKPHOS 51 03/11/2020   AST 15 03/11/2020  ALT 18 03/11/2020   Last lipids Lab Results  Component Value Date   CHOL 238 (H) 03/11/2020   HDL 78 03/11/2020   LDLCALC 151 (H) 03/11/2020   TRIG 56 03/11/2020   CHOLHDL 3.1 03/11/2020   Last hemoglobin A1c Lab Results  Component Value Date   HGBA1C 6.7 (A) 07/22/2020      Objective    BP (!) 141/67 (BP Location: Right Arm, Patient Position: Sitting, Cuff Size: Large)   Pulse 88   Temp 98.5 F (36.9 C) (Oral)   Resp 16   Ht 5\' 1"  (1.549 m)   Wt 187 lb (84.8 kg)   SpO2 100%   BMI 35.33 kg/m  BP Readings from Last 3 Encounters:  07/22/20 (!) 141/67  03/11/20 (!) 143/84  07/24/19 (!) 143/80   Wt Readings from Last 3 Encounters:   07/22/20 187 lb (84.8 kg)  03/11/20 182 lb 9.6 oz (82.8 kg)  07/24/19 174 lb (78.9 kg)     Physical Exam Constitutional:      General: She is not in acute distress.    Appearance: She is well-developed.  HENT:     Head: Normocephalic and atraumatic.     Right Ear: Hearing and tympanic membrane normal.     Left Ear: Hearing and tympanic membrane normal.     Nose: Nose normal.  Eyes:     General: Lids are normal. No scleral icterus.       Right eye: No discharge.        Left eye: No discharge.     Conjunctiva/sclera: Conjunctivae normal.  Neck:     Vascular: No carotid bruit.  Cardiovascular:     Rate and Rhythm: Normal rate and regular rhythm.     Pulses: Normal pulses.     Heart sounds: Normal heart sounds.  Pulmonary:     Effort: Pulmonary effort is normal. No respiratory distress.     Breath sounds: Normal breath sounds.  Genitourinary:    Comments: Deferred to GYN today. Musculoskeletal:        General: Normal range of motion.     Cervical back: Neck supple.  Skin:    Findings: No lesion or rash.  Neurological:     Mental Status: She is alert and oriented to person, place, and time.  Psychiatric:        Speech: Speech normal.        Behavior: Behavior normal.        Thought Content: Thought content normal.       Diabetic Foot Form - Detailed   Diabetic Foot Exam - detailed Diabetic Foot exam was performed with the following findings: Yes 07/22/2020  2:25 PM  Visual Foot Exam completed.: Yes  Can the patient see the bottom of their feet?: Yes Are the shoes appropriate in style and fit?: Yes Is there swelling or and abnormal foot shape?: No Is there a claw toe deformity?: No Is there elevated skin temparature?: No Is there foot or ankle muscle weakness?: No Normal Range of Motion: Yes Pulse Foot Exam completed.: Yes  Right posterior Tibialias: Present Left posterior Tibialias: Present  Right Dorsalis Pedis: Present Left Dorsalis Pedis: Present  Sensory  Foot Exam Completed.: Yes Semmes-Weinstein Monofilament Test R Site 1-Great Toe: Pos L Site 1-Great Toe: Pos         Results for orders placed or performed in visit on 07/22/20  POCT glycosylated hemoglobin (Hb A1C)  Result Value Ref Range   Hemoglobin A1C 6.7 (A) 4.0 -  5.6 %   Est. average glucose Bld gHb Est-mCnc 146     Assessment & Plan     1. Type 2 diabetes mellitus without complication, without long-term current use of insulin (HCC) Hgb A1C 6.7% today. Continue Metformin and should follow diabetic diet. Recheck labs and exercise 30-40 minutes 3-4 times a week. Recheck prn lab reports. No polyuria, polydipsia or peripheral neuropathy. - POCT glycosylated hemoglobin (Hb A1C) - metFORMIN (GLUCOPHAGE) 1000 MG tablet; TAKE 1 TABLET BY MOUTH IN THE MORNING AND ONE-HALF TABLET IN THE EVENING  Dispense: 135 tablet; Refill: 3 - CBC with Differential/Platelet - Comprehensive metabolic panel - Lipid panel  2. Mixed hyperlipidemia Tolerating the Welchol with low fat diet and exercise. Recheck follow up labs and refill Welchol. - colesevelam (WELCHOL) 625 MG tablet; Take 2 tablets (1,250 mg total) by mouth 2 (two) times daily with a meal.  Dispense: 360 tablet; Refill: 3 - CBC with Differential/Platelet - Comprehensive metabolic panel - Lipid panel - TSH  3. Essential (primary) hypertension Fair control. May need to restart the Ramapril 1.25 mg qd for renal protection and BP control. Recheck labs. - CBC with Differential/Platelet - Comprehensive metabolic panel - TSH  4. Chest pain on breathing No further discomfort after using Pepto-Bismol. Scheduled for cardiology evaluation. - CBC with Differential/Platelet - Comprehensive metabolic panel  5. Need for influenza vaccination - Flu Vaccine QUAD 36+ mos IM   No follow-ups on file.     Haywood Pao, PA, have reviewed all documentation for this visit. The documentation on 07/22/20 for the exam, diagnosis, procedures,  and orders are all accurate and complete.    Dortha Kern, PA  [facility] [phone] (phone) [phone] (fax)  [facility]

## 2020-08-03 ENCOUNTER — Other Ambulatory Visit: Payer: Self-pay

## 2020-08-03 ENCOUNTER — Encounter: Payer: Self-pay | Admitting: Internal Medicine

## 2020-08-03 ENCOUNTER — Telehealth: Payer: Self-pay | Admitting: Internal Medicine

## 2020-08-03 ENCOUNTER — Ambulatory Visit: Payer: Commercial Managed Care - PPO | Admitting: Internal Medicine

## 2020-08-03 ENCOUNTER — Other Ambulatory Visit
Admission: RE | Admit: 2020-08-03 | Discharge: 2020-08-03 | Disposition: A | Discharge status: Home / Self Care | Payer: Commercial Managed Care - PPO | Attending: Internal Medicine | Admitting: Internal Medicine

## 2020-08-03 VITALS — BP 148/80 | HR 92 | Ht 61.0 in | Wt 186.0 lb

## 2020-08-03 DIAGNOSIS — N912 Amenorrhea, unspecified: Secondary | ICD-10-CM | POA: Insufficient documentation

## 2020-08-03 DIAGNOSIS — R079 Chest pain, unspecified: Secondary | ICD-10-CM

## 2020-08-03 DIAGNOSIS — R002 Palpitations: Secondary | ICD-10-CM

## 2020-08-03 DIAGNOSIS — R9431 Abnormal electrocardiogram [ECG] [EKG]: Secondary | ICD-10-CM

## 2020-08-03 LAB — PREGNANCY, URINE: Preg Test, Ur: NEGATIVE

## 2020-08-03 NOTE — Patient Instructions (Signed)
Medication Instructions:  Your physician recommends that you continue on your current medications as directed. Please refer to the Current Medication list given to you today.  *If you need a refill on your cardiac medications before your next appointment, please call your pharmacy*   Lab Work: Your physician recommends that you return for lab work TODAY AT THE [facility]ICAL MALL FOR URINE PREGNANCY TEST. - Please go to the [facility]. You will check in at the front desk to the right as you walk into the atrium. Valet Parking is offered if needed. - No appointment needed. You may go any day between 7 am and 6 pm.  If you have labs (blood work) drawn today and your tests are completely normal, you will receive your results only by: Marland Kitchen MyChart Message (if you have MyChart) OR . A paper copy in the mail If you have any lab test that is abnormal or we need to change your treatment, we will call you to review the results.   Testing/Procedures:  Your physician has requested that you have an exercise stress myoview. I will contact [facility] and see if we can have it ordered there for you.  [facility] Exercise MYOVIEW  Your caregiver has ordered a Stress Test with nuclear imaging. The purpose of this test is to evaluate the blood supply to your heart muscle. This procedure is referred to as a "Non-Invasive Stress Test." This is because other than having an IV started in your vein, nothing is inserted or "invades" your body. Cardiac stress tests are done to find areas of poor blood flow to the heart by determining the extent of coronary artery disease (CAD). Some patients exercise on a treadmill, which naturally increases the blood flow to your heart, while others who are  unable to walk on a treadmill due to physical limitations have a pharmacologic/chemical stress agent called Lexiscan . This medicine will mimic walking on a treadmill by temporarily increasing your coronary blood flow.   Please  note: these test may take anywhere between 2-4 hours to complete  PLEASE REPORT TO [facility] MEDICAL [facility] ENTRANCE  THE VOLUNTEERS AT THE FIRST DESK WILL DIRECT YOU WHERE TO GO  Date of Procedure:_____________________________________  Arrival Time for Procedure:______________________________  Instructions regarding medication:   _xx_ : Hold diabetes medication morning of procedure - Metformin  PLEASE NOTIFY THE OFFICE AT LEAST 24 HOURS IN ADVANCE IF YOU ARE UNABLE TO KEEP YOUR APPOINTMENT.  [phone] AND  PLEASE NOTIFY NUCLEAR MEDICINE AT [facility] AT LEAST 24 HOURS IN ADVANCE IF YOU ARE UNABLE TO KEEP YOUR APPOINTMENT. [phone]  How to prepare for your Myoview test:  1. Do not eat or drink after midnight 2. No caffeine for 24 hours prior to test 3. No smoking 24 hours prior to test. 4. Your medication may be taken with water.  If your doctor stopped a medication because of this test, do not take that medication. 5. Ladies, please do not wear dresses.  Skirts or pants are appropriate. Please wear a short sleeve shirt. 6. No perfume, cologne or lotion. 7. Wear comfortable walking shoes. No heels!    Follow-Up: At [facility], you and your health needs are our priority.  As part of our continuing mission to provide you with exceptional heart care, we have created designated Provider Care Teams.  These Care Teams include your primary Cardiologist (physician) and Advanced Practice Providers (APPs -  Physician Assistants and Nurse Practitioners) who all work together to provide you with  the care you need, when you need it.  We recommend signing up for the patient portal called "MyChart".  Sign up information is provided on this After Visit Summary.  MyChart is used to connect with patients for Virtual Visits (Telemedicine).  Patients are able to view lab/test results, encounter notes, upcoming appointments, etc.  Non-urgent messages can be sent to your provider as well.   To learn more  about what you can do with MyChart, go to [email or web address].    Your next appointment:   1 month(s)  The format for your next appointment:   In Person  Provider:   You may see DR Cristal Deer END or one of the following Advanced Practice Providers on your designated Care Team:    Nicolasa Ducking, NP  Eula Listen, PA-C  Marisue Ivan, PA-C  Cadence Fransico Michael, [place]    Cardiac Nuclear Scan A cardiac nuclear scan is a test that measures blood flow to the heart when a person is resting and when he or she is exercising. The test looks for problems such as:  Not enough blood reaching a portion of the heart.  The heart muscle not working normally. You may need this test if:  You have heart disease.  You have had abnormal lab results.  You have had heart surgery or a balloon procedure to open up blocked arteries (angioplasty).  You have chest pain.  You have shortness of breath. In this test, a radioactive dye (tracer) is injected into your bloodstream. After the tracer has traveled to your heart, an imaging device is used to measure how much of the tracer is absorbed by or distributed to various areas of your heart. This procedure is usually done at a hospital and takes 2-4 hours. Tell a health care provider about:  Any allergies you have.  All medicines you are taking, including vitamins, herbs, eye drops, creams, and over-the-counter medicines.  Any problems you or family members have had with anesthetic medicines.  Any blood disorders you have.  Any surgeries you have had.  Any medical conditions you have.  Whether you are pregnant or may be pregnant. What are the risks? Generally, this is a safe procedure. However, problems may occur, including:  Serious chest pain and heart attack. This is only a risk if the stress portion of the test is done.  Rapid heartbeat.  Sensation of warmth in your chest. This usually passes quickly.  Allergic reaction to the  tracer. What happens before the procedure?  Ask your health care provider about changing or stopping your regular medicines. This is especially important if you are taking diabetes medicines or blood thinners.  Follow instructions from your health care provider about eating or drinking restrictions.  Remove your jewelry on the day of the procedure. What happens during the procedure?  An IV will be inserted into one of your veins.  Your health care provider will inject a small amount of radioactive tracer through the IV.  You will wait for 20-40 minutes while the tracer travels through your bloodstream.  Your heart activity will be monitored with an electrocardiogram (ECG).  You will lie down on an exam table.  Images of your heart will be taken for about 15-20 minutes.  You may also have a stress test. For this test, one of the following may be done: ? You will exercise on a treadmill or stationary bike. While you exercise, your heart's activity will be monitored with an ECG, and your blood pressure will be  checked. ? You will be given medicines that will increase blood flow to parts of your heart. This is done if you are unable to exercise.  When blood flow to your heart has peaked, a tracer will again be injected through the IV.  After 20-40 minutes, you will get back on the exam table and have more images taken of your heart.  Depending on the type of tracer used, scans may need to be repeated 3-4 hours later.  Your IV line will be removed when the procedure is over. The procedure may vary among health care providers and hospitals. What happens after the procedure?  Unless your health care provider tells you otherwise, you may return to your normal schedule, including diet, activities, and medicines.  Unless your health care provider tells you otherwise, you may increase your fluid intake. This will help to flush the contrast dye from your body. Drink enough fluid to keep  your urine pale yellow.  Ask your health care provider, or the department that is doing the test: ? When will my results be ready? ? How will I get my results? Summary  A cardiac nuclear scan measures the blood flow to the heart when a person is resting and when he or she is exercising.  Tell your health care provider if you are pregnant.  Before the procedure, ask your health care provider about changing or stopping your regular medicines. This is especially important if you are taking diabetes medicines or blood thinners.  After the procedure, unless your health care provider tells you otherwise, increase your fluid intake. This will help flush the contrast dye from your body.  After the procedure, unless your health care provider tells you otherwise, you may return to your normal schedule, including diet, activities, and medicines. This information is not intended to replace advice given to you by your health care provider. Make sure you discuss any questions you have with your health care provider. Document Revised: 03/03/2018 Document Reviewed: 03/03/2018 Elsevier Patient Education  [place].

## 2020-08-03 NOTE — Progress Notes (Signed)
New Outpatient Visit Date: 08/03/2020  Referring Provider: Tamsen Roers, PA [address] [place],  [place] [address]  Chief Complaint: Chest pain and palpitations  HPI:  [name] is a 49 y.o. female who is being seen today for the evaluation of chest pain and palpitations at the request of Mr. Shella Spearing. She has a history of hypertension, hyperlipidemia, type 2 diabetes mellitus, and obesity.  2 weeks ago, Cheyenne Acosta reports that she had a transient episode of central chest pain that occurred when she took a deep breath.  This has not recurred since.  However, last night after exercising, she began to notice palpitations while relaxing.  She felt as though her heart were racing.  This lasted about a minute before stopping on its own.  There were no associated symptoms such as chest pain, shortness of breath, and lightheadedness.  Ms. Handa exercises regularly, walking/running at least 2 miles a day without any limitations.  She has not experienced any orthopnea, PND, or edema.  Cheyenne Acosta denies a history of prior cardiac disease.  Her blood pressure is typically normal when checked at home.  She has been on several cholesterol medications in the past including several statins, red yeast rice, and niacin.  She did not tolerate any of these and is now on Welchol once daily, which she seems to be tolerating okay.  She notes a history of heavy menstrual periods, though her most recent period was lighter than usual.  She reports having labs drawn earlier today through Mr. Tania Ade office.  She reports having been on an ACE inhibitor in the past for renal protection in the setting of diabetes mellitus.  This was stopped due to low blood pressures, as she reports that she does not have a history of hypertension.  --------------------------------------------------------------------------------------------------  Cardiovascular History & Procedures: Cardiovascular Problems:  Chest  pain  Palpitations  Risk Factors:  Hypertension, hyperlipidemia, diabetes mellitus, obesity  Cath/PCI:  None  CV Surgery:  None  EP Procedures and Devices:  None  Non-Invasive Evaluation(s):  None  Recent CV Pertinent Labs: Lab Results  Component Value Date   CHOL 260 (H) 08/03/2020   HDL 83 08/03/2020   LDLCALC 170 (H) 08/03/2020   TRIG 50 08/03/2020   CHOLHDL 3.1 08/03/2020   K 4.6 08/03/2020   BUN 8 08/03/2020   CREATININE 0.71 08/03/2020    --------------------------------------------------------------------------------------------------  Past Medical History:  Diagnosis Date  . Diabetes mellitus without complication (HCC)   . Hyperlipidemia     Past Surgical History:  Procedure Laterality Date  . CESAREAN SECTION      Current Meds  Medication Sig  . colesevelam (WELCHOL) 625 MG tablet Take by mouth daily with breakfast.  . diazepam (VALIUM) 5 MG tablet Take 1 tablet by mouth every 12 hours as needed for muscle spasms.  . metFORMIN (GLUCOPHAGE) 1000 MG tablet TAKE 1 TABLET BY MOUTH IN THE MORNING AND ONE-HALF TABLET IN THE EVENING  . Omega-3 Fatty Acids (FISH OIL) 1200 MG CAPS Take by mouth.  . [DISCONTINUED] colesevelam (WELCHOL) 625 MG tablet Take 2 tablets (1,250 mg total) by mouth 2 (two) times daily with a meal. (Patient taking differently: Take 625 mg by mouth daily with breakfast. )    Allergies: Latex, Shellfish allergy, Aspirin, and Sulfa antibiotics  Social History   Tobacco Use  . Smoking status: Never Smoker  . Smokeless tobacco: Never Used  Substance Use Topics  . Alcohol use: No    Alcohol/week: 0.0 standard drinks  .  Drug use: No    Family History  Problem Relation Age of Onset  . Diabetes Mother   . Heart attack Mother 85  . Prostate cancer Father   . Hypertension Father   . Healthy Son     Review of Systems: A 12-system review of systems was performed and was negative except as noted in the  HPI.  --------------------------------------------------------------------------------------------------  Physical Exam: BP (!) 148/80 (BP Location: Right Arm, Patient Position: Sitting, Cuff Size: Normal)   Pulse 92   Ht 5\' 1"  (1.549 m)   Wt 186 lb (84.4 kg)   SpO2 98%   BMI 35.14 kg/m   General: NAD. HEENT: No conjunctival pallor or scleral icterus. Facemask in place. Neck: Supple without lymphadenopathy, thyromegaly, JVD, or HJR. No carotid bruit. Lungs: Normal work of breathing. Clear to auscultation bilaterally without wheezes or crackles. Heart: Regular rate and rhythm without murmurs, rubs, or gallops. Non-displaced PMI. Abd: Bowel sounds present. Soft, NT/ND without hepatosplenomegaly Ext: No lower extremity edema. Radial, PT, and DP pulses are 2+ bilaterally Skin: Warm and dry without rash. Neuro: CNIII-XII intact. Strength and fine-touch sensation intact in upper and lower extremities bilaterally. Psych: Normal mood and affect.  EKG: Normal sinus rhythm with inferolateral ST/T changes, similar to prior tracing from 12/05/2017.  Lab Results  Component Value Date   WBC 5.2 08/03/2020   HGB 9.5 (L) 08/03/2020   HCT 31.5 (L) 08/03/2020   MCV 73 (L) 08/03/2020   PLT 252 08/03/2020    Lab Results  Component Value Date   NA 138 08/03/2020   K 4.6 08/03/2020   CL 101 08/03/2020   CO2 23 08/03/2020   BUN 8 08/03/2020   CREATININE 0.71 08/03/2020   GLUCOSE 87 08/03/2020   ALT 11 08/03/2020    Lab Results  Component Value Date   CHOL 260 (H) 08/03/2020   HDL 83 08/03/2020   LDLCALC 170 (H) 08/03/2020   TRIG 50 08/03/2020   CHOLHDL 3.1 08/03/2020     --------------------------------------------------------------------------------------------------  ASSESSMENT AND PLAN: Atypical chest pain, palpitations, and abnormal EKG: Cheyenne Acosta reports isolated episodes of atypical chest pain and brief, self-limited palpitations.  She has several cardiac risk factors  including hypertension, hyperlipidemia, diabetes mellitus, and obesity.  Her examination today is unremarkable.  Her EKG shows inferolateral ST/T changes that could reflect underlying ischemia but are unchanged since 11/2017.  I have recommended that we proceed with noninvasive ischemia testing.  Given a shellfish allergy, Ms. Boyum is reluctant to undergo testing that would use iodinated contrast, though I have reassured her that the likelihood of a contrast allergy remains low.  As such, I have recommended an exercise myocardial perfusion stress test.  Ms. Puryear would like for this test to be performed at [facility], if possible.  We will attempt to facilitate this study there, though if we are unsuccessful, she would be willing to come to [facility] to complete the test.  I will defer adding aspirin given history of intolerance.  Based on results of the stress test and recurrence of symptoms, we will need to consider echocardiogram and or ambulatory cardiac monitoring in the future.  Hypertension: Though Ms. Caffee denies a history of hypertension, review of blood pressure checks during office visits dating back to 07/2019 is notable for elevated blood pressures (goal less than 130/80).  I have encouraged Ms. Throne to keep working on lifestyle modifications, though we will need to readdress pharmacotherapy after completion of her stress test if her blood  pressure remains above target.  Hyperlipidemia: Ms. Kjos has been intolerant of multiple statins and is currently on Welchol.  If there is evidence of ASCVD by aforementioned testing, I would favor trial of a PCSK9 inhibitor.  Irregular menstrual period: Ms. Kinderman notes a lighter than usual period.  Given that she does not use contraception and is sexually active, we will check a urine pregnancy test today.  Follow-up: Return to clinic in 1 month.  Yvonne Kendall, MD 08/04/2020 6:55 AM

## 2020-08-03 NOTE — Telephone Encounter (Signed)
Attempted to schedule  appts at check out patient wants to wait on scheduling to see if lexi can be done at [facility].   Will fu after advise from RN

## 2020-08-04 ENCOUNTER — Telehealth: Payer: Self-pay

## 2020-08-04 ENCOUNTER — Other Ambulatory Visit: Payer: Self-pay | Admitting: *Deleted

## 2020-08-04 ENCOUNTER — Encounter: Payer: Self-pay | Admitting: Internal Medicine

## 2020-08-04 DIAGNOSIS — R079 Chest pain, unspecified: Secondary | ICD-10-CM | POA: Insufficient documentation

## 2020-08-04 DIAGNOSIS — R9431 Abnormal electrocardiogram [ECG] [EKG]: Secondary | ICD-10-CM | POA: Insufficient documentation

## 2020-08-04 DIAGNOSIS — R002 Palpitations: Secondary | ICD-10-CM | POA: Insufficient documentation

## 2020-08-04 LAB — CBC WITH DIFFERENTIAL/PLATELET
Basophils Absolute: 0 10*3/uL (ref 0.0–0.2)
Basos: 0 %
EOS (ABSOLUTE): 0.2 10*3/uL (ref 0.0–0.4)
Eos: 5 %
Hematocrit: 31.5 % — ABNORMAL LOW (ref 34.0–46.6)
Hemoglobin: 9.5 g/dL — ABNORMAL LOW (ref 11.1–15.9)
Immature Grans (Abs): 0 10*3/uL (ref 0.0–0.1)
Immature Granulocytes: 0 %
Lymphocytes Absolute: 2.4 10*3/uL (ref 0.7–3.1)
Lymphs: 46 %
MCH: 22.1 pg — ABNORMAL LOW (ref 26.6–33.0)
MCHC: 30.2 g/dL — ABNORMAL LOW (ref 31.5–35.7)
MCV: 73 fL — ABNORMAL LOW (ref 79–97)
Monocytes Absolute: 0.5 10*3/uL (ref 0.1–0.9)
Monocytes: 9 %
Neutrophils Absolute: 2.1 10*3/uL (ref 1.4–7.0)
Neutrophils: 40 %
Platelets: 252 10*3/uL (ref 150–450)
RBC: 4.3 x10E6/uL (ref 3.77–5.28)
RDW: 17 % — ABNORMAL HIGH (ref 11.7–15.4)
WBC: 5.2 10*3/uL (ref 3.4–10.8)

## 2020-08-04 LAB — COMPREHENSIVE METABOLIC PANEL
ALT: 11 IU/L (ref 0–32)
AST: 14 IU/L (ref 0–40)
Albumin/Globulin Ratio: 1.3 (ref 1.2–2.2)
Albumin: 4.2 g/dL (ref 3.8–4.9)
Alkaline Phosphatase: 65 IU/L (ref 44–121)
BUN/Creatinine Ratio: 11 (ref 9–23)
BUN: 8 mg/dL (ref 6–24)
Bilirubin Total: 0.2 mg/dL (ref 0.0–1.2)
CO2: 23 mmol/L (ref 20–29)
Calcium: 9 mg/dL (ref 8.7–10.2)
Chloride: 101 mmol/L (ref 96–106)
Creatinine, Ser: 0.71 mg/dL (ref 0.57–1.00)
GFR calc Af Amer: 114 mL/min/{1.73_m2} (ref >59)
GFR calc non Af Amer: 99 mL/min/{1.73_m2} (ref >59)
Globulin, Total: 3.3 g/dL (ref 1.5–4.5)
Glucose: 87 mg/dL (ref 65–99)
Potassium: 4.6 mmol/L (ref 3.5–5.2)
Sodium: 138 mmol/L (ref 134–144)
Total Protein: 7.5 g/dL (ref 6.0–8.5)

## 2020-08-04 LAB — LIPID PANEL
Chol/HDL Ratio: 3.1 ratio (ref 0.0–4.4)
Cholesterol, Total: 260 mg/dL — ABNORMAL HIGH (ref 100–199)
HDL: 83 mg/dL (ref >39)
LDL Chol Calc (NIH): 170 mg/dL — ABNORMAL HIGH (ref 0–99)
Triglycerides: 50 mg/dL (ref 0–149)
VLDL Cholesterol Cal: 7 mg/dL (ref 5–40)

## 2020-08-04 LAB — TSH: TSH: 0.694 u[IU]/mL (ref 0.450–4.500)

## 2020-08-04 NOTE — Telephone Encounter (Signed)
-----   Message from [employer], [place] sent at 08/04/2020  8:26 AM EDT ----- Hgb and Hct low with low MCV, MCH and MCHC which indicates low iron anemia. Ask lab to run Iron with IBC, Vitamin B12 and Folate level from this specimen and recommend OC-Light test at home for any blood loss through bowels for this anemia.

## 2020-08-04 NOTE — Telephone Encounter (Signed)
Add on request faxed to [facility] confirmation. I called and advised patient of results. Patient says she just mailed off her Cologuard kit, so she doesn't want to do the OC light kit. She says any blood in the GI tract should be detected on the Cologuard. Patient also wanted to remind Simona Huh that she has uterine fibroids and heavy menstrual cycles. Patient believes the Anemia could be coming from that.

## 2020-08-05 NOTE — Telephone Encounter (Signed)
[facility] Scheduling to check to make sure they received the [facility] order for patient.   Was on hold for 30 minutes and no answer. Had to hang up. Will try again next week to follow up and make sure the exercise myoview is scheduled.

## 2020-08-05 NOTE — Telephone Encounter (Signed)
Yesterday, I spoke with [facility] nuclear medicine department. I was given the following numbers for scheduling department - FAX [phone]  PHONE [phone].  Today, I faxed the order and demographics and received successful transmission of the fax to [phone]. Will call to confirm later this afternoon or on Monday that they will call patient to scheduled.   Per precert department, no authorization is needed for CPT [phone].

## 2020-08-07 LAB — SPECIMEN STATUS REPORT

## 2020-08-07 LAB — B12 AND FOLATE PANEL
Folate: 10.6 ng/mL (ref >3.0)
Vitamin B-12: 1223 pg/mL (ref 232–1245)

## 2020-08-07 LAB — IRON AND TIBC
Iron Saturation: 7 % — CL (ref 15–55)
Iron: 25 ug/dL — ABNORMAL LOW (ref 27–159)
Total Iron Binding Capacity: 381 ug/dL (ref 250–450)
UIBC: 356 ug/dL (ref 131–425)

## 2020-08-08 ENCOUNTER — Telehealth: Payer: Self-pay | Admitting: Internal Medicine

## 2020-08-08 ENCOUNTER — Telehealth: Payer: Self-pay | Admitting: Family Medicine

## 2020-08-08 NOTE — Telephone Encounter (Signed)
Copied from CRM [id number]. Topic: Quick Communication - Rx Refill/Question >> Aug 05, 2020  3:24 PM Randol Kern wrote: Pt called to report that she wants to start a statin medication because her cholesterol is high according to her MyChart. She is also requesting a Rx for iron, bc her results are low.

## 2020-08-08 NOTE — Telephone Encounter (Signed)
Spoke to [employer] at [place]. She needed a few questions answered to schedule the test on their end about patient weight and BMI. Questions answered.  They will call patient to schedule and explain the process for COVID on their end.

## 2020-08-08 NOTE — Telephone Encounter (Signed)
Please call [facility] to discuss which procedure is being requested at [facility]. Also the nurse needs to discuss if patient is on a beta blocker.

## 2020-08-09 NOTE — Telephone Encounter (Signed)
New telephone encounter opened yesterday to address this.  [facility] is aware and will call patient to schedule.

## 2020-09-02 ENCOUNTER — Telehealth: Payer: Self-pay | Admitting: Family Medicine

## 2020-09-02 ENCOUNTER — Ambulatory Visit: Payer: Self-pay | Admitting: *Deleted

## 2020-09-02 ENCOUNTER — Other Ambulatory Visit: Payer: Self-pay | Admitting: Family Medicine

## 2020-09-02 DIAGNOSIS — I10 Essential (primary) hypertension: Secondary | ICD-10-CM

## 2020-09-02 MED ORDER — RAMIPRIL 5 MG PO CAPS: 5.0000 mg | ORAL_CAPSULE | Freq: Every day | ORAL | 3 refills | Status: DC

## 2020-09-02 NOTE — Telephone Encounter (Signed)
Ramipril 5 mg requested- sent for review. Last filled 12/19/17 with 1 yr RF

## 2020-09-02 NOTE — Telephone Encounter (Signed)
Patient is requesting medication no longer current on medication list- patient will need new Rx-

## 2020-09-02 NOTE — Telephone Encounter (Signed)
Sent 30 day refill of Ramipril. Should schedule follow up appointment to assess progress of control before further refills.

## 2020-09-02 NOTE — Telephone Encounter (Signed)
Medication Refill - Medication: Ramipril  Has the patient contacted their pharmacy? No. (Agent: If no, request that the patient contact the pharmacy for the refill.) (Agent: If yes, when and what did the pharmacy advise?)  Preferred Pharmacy (with phone number or street name): [id number] - [place], [place] - [address]  Agent: Please be advised that RX refills may take up to 3 business days. We ask that you follow-up with your pharmacy.

## 2020-09-02 NOTE — Telephone Encounter (Signed)
  Patient requesting medication, ramipril 5 mg  to be called in to pharmacy now due to B/P elevated during procedure today at [facility]. Patient unable to recheck B/P at this time and does not report symptoms of headache, blurred vision. Patient does not want to go to UC .Patient reports she does not need another appt because PCP has had her to take this medication before for prevention of high blood pressure. Patient reporting B/P today of 248/ 60's. Called [facility] Joni Reining and noted My Chart appt available at 4p today . Attempted to call patient back x 2 to set up appt and no answer and unable to leave voicemail due to mailbox full. Care advise given. Patient very upset that she could not have medication called in to pharmacy now. Continued to encouraged patient to seek assistance at [facility].if symptoms worsen.     Reason for Disposition . [1] Prescription refill request for ESSENTIAL medicine (i.e., likelihood of harm to patient if not taken) AND [2] triager unable to refill per department policy  Answer Assessment - Initial Assessment Questions 1. DRUG NAME: "What medicine do you need to have refilled?"     ramitril 5 mg 2. REFILLS REMAINING: "How many refills are remaining?" (Note: The label on the medicine or pill bottle will show how many refills are remaining. If there are no refills remaining, then a renewal may be needed.)     None. Medication not of medication list  3. EXPIRATION DATE: "What is the expiration date?" (Note: The label states when the prescription will expire, and thus can no longer be refilled.)     na 4. PRESCRIBING HCP: "Who prescribed it?" Reason: If prescribed by specialist, call should be referred to that group.     D. Chrismon, PA per patient 5. SYMPTOMS: "Do you have any symptoms?"     None reported but elevated B/P 6. PREGNANCY: "Is there any chance that you are pregnant?" "When was your last menstrual period?"     na  Protocols used: MEDICATION REFILL AND RENEWAL CALL-A-AH

## 2020-09-05 ENCOUNTER — Telehealth: Payer: Self-pay | Admitting: Internal Medicine

## 2020-09-05 NOTE — Telephone Encounter (Signed)
Please let Ms. Blucher know that I have reviewed the results of her stress test at [facility] on 09/02/20.  The study is normal without evidence of a significant blockage.  I recommend that she continue her current medications and follow-up as previously scheduled to reassess her symptoms and determine if additional testing or intervention is necessary.  Yvonne Kendall, MD [facility]

## 2020-09-06 NOTE — Telephone Encounter (Signed)
No answer. Left detailed message with results, ok per DPR, and to call back if any questions. Patient needs to schedule follow up appointment.   MyChart message sent to patient with results and to call for appointment as well.

## 2020-09-29 ENCOUNTER — Telehealth: Payer: Self-pay | Admitting: Family Medicine

## 2020-09-29 NOTE — Telephone Encounter (Signed)
Pt called In for assistance. Pt is currently taking colesevelam (WELCHOL) 625 MG tablet and says that it hurts her stomach. Pt would like to know if provider could change her medication to a statin instead?    Please assist pt further     Pharmacy:   [id number] [place], [place] - [address] Phone:  [phone]  Fax:  [phone]

## 2020-10-18 ENCOUNTER — Other Ambulatory Visit: Payer: Self-pay | Admitting: Family Medicine

## 2020-10-18 DIAGNOSIS — E119 Type 2 diabetes mellitus without complications: Secondary | ICD-10-CM

## 2020-10-29 ENCOUNTER — Other Ambulatory Visit: Payer: Self-pay | Admitting: Family Medicine

## 2020-10-29 DIAGNOSIS — G8929 Other chronic pain: Secondary | ICD-10-CM

## 2020-10-30 NOTE — Telephone Encounter (Signed)
Requested medications are due for refill today.  yes  Requested medications are on the active medications list.  yes  Last refill. 08/24/2019  Future visit scheduled.   no  Notes to clinic.  Not delegated. 

## 2021-01-24 ENCOUNTER — Encounter: Payer: Self-pay | Admitting: Family Medicine

## 2021-01-24 ENCOUNTER — Other Ambulatory Visit: Payer: Self-pay | Admitting: Family Medicine

## 2021-01-24 DIAGNOSIS — E7800 Pure hypercholesterolemia, unspecified: Secondary | ICD-10-CM

## 2021-01-24 MED ORDER — SIMVASTATIN 40 MG PO TABS
40.0000 mg | ORAL_TABLET | Freq: Every day | ORAL | 3 refills | Status: DC
Start: 2021-01-24 — End: 2022-02-05

## 2021-02-10 ENCOUNTER — Telehealth: Payer: Self-pay

## 2021-02-10 NOTE — Telephone Encounter (Signed)
Should consider cardiology referral if having chest pain/pressure with shortness of breath. May need evaluation at the ER to rule out angina versus MI.

## 2021-02-10 NOTE — Telephone Encounter (Signed)
Pt is at the ER now.   Thanks,   -Vernona Rieger

## 2021-02-10 NOTE — Telephone Encounter (Signed)
Copied from CRM [id number]. Topic: General - Inquiry >> Feb 10, 2021  8:24 AM Crist Infante wrote: Reason for CRM: pt wants to know if Maurine Minister will do a virtual appt so she can get labs done ie: Troponin test this Monday  . Pt states she is experiencing pain with exertion when she exercises and/or walks. Afterwards it goes away.   pt NOT having any pain now.,  but if she were to exercise, she says it comes back- every time. Would like Maurine Minister to call

## 2021-02-14 ENCOUNTER — Encounter: Payer: Self-pay | Admitting: Family Medicine

## 2021-02-20 ENCOUNTER — Encounter: Payer: Self-pay | Admitting: Family Medicine

## 2021-02-20 ENCOUNTER — Ambulatory Visit: Payer: Commercial Managed Care - PPO | Admitting: Family Medicine

## 2021-02-20 ENCOUNTER — Other Ambulatory Visit: Payer: Self-pay

## 2021-02-20 VITALS — BP 142/78 | HR 85 | Temp 98.6°F | Wt 195.0 lb

## 2021-02-20 DIAGNOSIS — E7800 Pure hypercholesterolemia, unspecified: Secondary | ICD-10-CM | POA: Diagnosis not present

## 2021-02-20 DIAGNOSIS — I2510 Atherosclerotic heart disease of native coronary artery without angina pectoris: Secondary | ICD-10-CM | POA: Diagnosis not present

## 2021-02-20 DIAGNOSIS — E119 Type 2 diabetes mellitus without complications: Secondary | ICD-10-CM

## 2021-02-20 DIAGNOSIS — Z9861 Coronary angioplasty status: Secondary | ICD-10-CM

## 2021-02-20 DIAGNOSIS — I10 Essential (primary) hypertension: Secondary | ICD-10-CM

## 2021-02-20 NOTE — Progress Notes (Signed)
Established patient visit   Patient: Cheyenne Acosta   DOB: 1971-03-07   50 y.o. Female  MRN: [id number] Visit Date: 02/20/2021  Today's healthcare provider: Dortha Kern, PA-C   No chief complaint on file.  Subjective    HPI  Patient would like blood work checked after being seen in the ER in May.  She would like liver, kidney, pancrease and diabetes checked. Patient was inpatient at [facility] after being seen in the ED on 02/10/21.  She had angioplasty, stent placement and multiple lab testing done at that time.  Past Medical History:  Diagnosis Date   Diabetes mellitus without complication (HCC)    Hyperlipidemia    Hypertension    Past Surgical History:  Procedure Laterality Date   CESAREAN SECTION     Family History  Problem Relation Age of Onset   Diabetes Mother    Heart attack Mother 11   Prostate cancer Father    Hypertension Father    Healthy Son    Social History   Tobacco Use   Smoking status: Never   Smokeless tobacco: Never  Substance Use Topics   Alcohol use: No    Alcohol/week: 0.0 standard drinks   Drug use: No   Allergies  Allergen Reactions   Latex    Shellfish Allergy    Aspirin     Abdominal discomfort   Sulfa Antibiotics Rash    Medications: Outpatient Medications Prior to Visit  Medication Sig   aspirin 81 MG chewable tablet Chew by mouth daily.   diazepam (VALIUM) 5 MG tablet TAKE 1 TABLET BY MOUTH EVERY 12 HOURS AS NEEDED FOR MUSCLE SPASMS.   metFORMIN (GLUCOPHAGE) 1000 MG tablet TAKE 1 TABLET BY MOUTH IN THE MORNING AND ONE-HALF TABLET IN THE EVENING   Omega-3 Fatty Acids (FISH OIL) 1200 MG CAPS Take by mouth.   ramipril (ALTACE) 5 MG capsule Take 1 capsule (5 mg total) by mouth daily.   simvastatin (ZOCOR) 40 MG tablet Take 1 tablet (40 mg total) by mouth at bedtime.   No facility-administered medications prior to visit.    Review of Systems  Respiratory: Negative for cough, shortness of breath and wheezing.    Cardiovascular: Negative for chest pain, palpitations and leg swelling.       Objective    BP (!) 142/78 (BP Location: Left Arm, Patient Position: Sitting, Cuff Size: Normal)   Pulse 85   Temp 98.6 F (37 C) (Oral)   Wt 195 lb (88.5 kg)   SpO2 100%   BMI 36.84 kg/m     Physical Exam Constitutional:      General: She is not in acute distress.    Appearance: She is well-developed.  HENT:     Head: Normocephalic and atraumatic.     Right Ear: Hearing and ear canal normal.     Left Ear: Hearing and ear canal normal.     Nose: Nose normal.  Eyes:     General: Lids are normal. No scleral icterus.       Right eye: No discharge.        Left eye: No discharge.     Conjunctiva/sclera: Conjunctivae normal.  Cardiovascular:     Rate and Rhythm: Normal rate and regular rhythm.     Pulses: Normal pulses.     Heart sounds: Normal heart sounds.  Pulmonary:     Effort: Pulmonary effort is normal. No respiratory distress.     Breath sounds: Normal breath sounds.  Abdominal:  General: Bowel sounds are normal.     Palpations: Abdomen is soft.  Musculoskeletal:        General: Normal range of motion.     Cervical back: Neck supple.  Skin:    Findings: No lesion or rash.  Neurological:     Mental Status: She is alert and oriented to person, place, and time.  Psychiatric:        Speech: Speech normal.        Behavior: Behavior normal.        Thought Content: Thought content normal.      No results found for any visits on 02/20/21.  Assessment & Plan      1. CAD S/P percutaneous coronary angioplasty Hospitalized at [facility] with chest pressure and status post angioplasty with stent from 02-20-21 through 02-11-21. Treated with Effient and ASA post procedure. No chest pains now and free of any dyspnea. Feeling well.  2. Type 2 diabetes mellitus without complication, without long-term current use of insulin (HCC) Hgb A1C was 10.4 during hospitalization on 02-10-21. Presently on  Metformin 1000 mg 1 tablet each morning and 1/2 tablet in the evenings. Must work on diabetic diet and progress with cardiac rehab for exercise. Should recheck labs in 3-4 months. Kidney and liver function tests all normal during hospitalization.  3. Pure hypercholesterolemia LDL was 147 during hospitalization. Tolerating Simvastatin 40 mg qd and plans follow up with cardiologist at [facility] soon.  4. Essential (primary) hypertension Tolerating Altace 5 mg qd without cough or swelling. BP in good shape today. Follow up with cardiologist as planned.  No follow-ups on file.      I, Glen Blatchley, PA-C, have reviewed all documentation for this visit. The documentation on 02/20/21 for the exam, diagnosis, procedures, and orders are all accurate and complete.    Dortha Kern, PA-C  [facility] [phone] (phone) [phone] (fax)  [facility]

## 2021-03-09 ENCOUNTER — Other Ambulatory Visit: Payer: Self-pay | Admitting: Family Medicine

## 2021-03-09 DIAGNOSIS — E119 Type 2 diabetes mellitus without complications: Secondary | ICD-10-CM

## 2021-03-09 NOTE — Telephone Encounter (Signed)
Requested Prescriptions  Pending Prescriptions Disp Refills  . metFORMIN (GLUCOPHAGE) 1000 MG tablet [Pharmacy Med Name: METFORMIN HCL 1,000 MG TABLET] 45 tablet 2    Sig: TAKE 1 TABLET BY MOUTH IN THE MORNING AND ONE-HALF TABLET IN THE EVENING     Endocrinology:  Diabetes - Biguanides Failed - 03/09/2021  1:43 AM      Failed - HBA1C is between 0 and 7.9 and within 180 days    Hemoglobin A1C  Date Value Ref Range Status  07/22/2020 6.7 (A) 4.0 - 5.6 % Final   Hgb A1c MFr Bld  Date Value Ref Range Status  03/11/2020 6.6 (H) 4.8 - 5.6 % Final    Comment:             Prediabetes: 5.7 - 6.4          Diabetes: >6.4          Glycemic control for adults with diabetes: <7.0          Passed - Cr in normal range and within 360 days    Creatinine, Ser  Date Value Ref Range Status  08/03/2020 0.71 0.57 - 1.00 mg/dL Final   Creatinine, POC  Date Value Ref Range Status  09/13/2017 NA mg/dL Final         Passed - AA eGFR in normal range and within 360 days    GFR calc Af Amer  Date Value Ref Range Status  08/03/2020 114 >59 mL/min/1.73 Final    Comment:    **In accordance with recommendations from the NKF-ASN Task force,**   [facility] is in the process of updating its eGFR calculation to the   2021 CKD-EPI creatinine equation that estimates kidney function   without a race variable.    GFR calc non Af Amer  Date Value Ref Range Status  08/03/2020 99 >59 mL/min/1.73 Final         Passed - Valid encounter within last 6 months    Recent Outpatient Visits          2 weeks ago Type 2 diabetes mellitus without complication, without long-term current use of insulin ([place])   [place], Vickki Muff, PA-C   7 months ago Type 2 diabetes mellitus without complication, without long-term current use of insulin ([employer])   [place], PA-C   12 months ago Annual physical exam   [place], PA-C   1 year ago  Type 2 diabetes mellitus without complication, without long-term current use of insulin ([employer])   [employer], Vickki Muff, PA-C   2 years ago Annual physical exam   [place], [place]

## 2021-04-06 ENCOUNTER — Other Ambulatory Visit: Payer: Self-pay | Admitting: Family Medicine

## 2021-04-06 DIAGNOSIS — I10 Essential (primary) hypertension: Secondary | ICD-10-CM

## 2021-06-07 ENCOUNTER — Telehealth: Payer: Self-pay

## 2021-06-07 ENCOUNTER — Other Ambulatory Visit: Payer: Self-pay | Admitting: Family Medicine

## 2021-06-07 DIAGNOSIS — E119 Type 2 diabetes mellitus without complications: Secondary | ICD-10-CM

## 2021-06-07 DIAGNOSIS — I10 Essential (primary) hypertension: Secondary | ICD-10-CM

## 2021-06-07 DIAGNOSIS — E782 Mixed hyperlipidemia: Secondary | ICD-10-CM

## 2021-06-07 DIAGNOSIS — I2510 Atherosclerotic heart disease of native coronary artery without angina pectoris: Secondary | ICD-10-CM

## 2021-06-07 DIAGNOSIS — Z9861 Coronary angioplasty status: Secondary | ICD-10-CM

## 2021-06-07 NOTE — Telephone Encounter (Signed)
Future orders placed in chart for release when she comes by for blood draw.

## 2021-06-07 NOTE — Telephone Encounter (Signed)
Requested medication (s) are due for refill today  Yes  Requested medication (s) are on the active medication list   Yes  Future visit scheduled No  LOV 02/20/21  Note to clinic-labs do not meet protocol. Last A1c 07/22/20. Routing for consideration   Requested Prescriptions  Pending Prescriptions Disp Refills   metFORMIN (GLUCOPHAGE) 1000 MG tablet [Pharmacy Med Name: METFORMIN HCL 1,000 MG TABLET] 45 tablet 2    Sig: TAKE 1 TABLET BY MOUTH IN THE MORNING AND ONE-HALF TABLET IN THE EVENING     Endocrinology:  Diabetes - Biguanides Failed - 06/07/2021  1:18 AM      Failed - HBA1C is between 0 and 7.9 and within 180 days    Hemoglobin A1C  Date Value Ref Range Status  07/22/2020 6.7 (A) 4.0 - 5.6 % Final   Hgb A1c MFr Bld  Date Value Ref Range Status  03/11/2020 6.6 (H) 4.8 - 5.6 % Final    Comment:             Prediabetes: 5.7 - 6.4          Diabetes: >6.4          Glycemic control for adults with diabetes: <7.0           Passed - Cr in normal range and within 360 days    Creatinine, Ser  Date Value Ref Range Status  08/03/2020 0.71 0.57 - 1.00 mg/dL Final   Creatinine, POC  Date Value Ref Range Status  09/13/2017 NA mg/dL Final          Passed - AA eGFR in normal range and within 360 days    GFR calc Af Amer  Date Value Ref Range Status  08/03/2020 114 >59 mL/min/1.73 Final    Comment:    **In accordance with recommendations from the NKF-ASN Task force,**   [facility] is in the process of updating its eGFR calculation to the   2021 CKD-EPI creatinine equation that estimates kidney function   without a race variable.    GFR calc non Af Amer  Date Value Ref Range Status  08/03/2020 99 >59 mL/min/1.73 Final          Passed - Valid encounter within last 6 months    Recent Outpatient Visits           3 months ago CAD S/P percutaneous coronary angioplasty   [place], PA-C   10 months ago Type 2 diabetes mellitus without  complication, without long-term current use of insulin ([place])   [place], Vickki Muff, PA-C   1 year ago Annual physical exam   [place], PA-C   1 year ago Type 2 diabetes mellitus without complication, without long-term current use of insulin ([place])   [employer], Vickki Muff, PA-C   2 years ago Annual physical exam   [place], [place]

## 2021-06-07 NOTE — Telephone Encounter (Signed)
Copied from CRM [id number]. Topic: Appointment Scheduling - Scheduling Inquiry for Clinic >> Jun 07, 2021 10:16 AM Traci Sermon wrote: Reason for CRM: Pt called wanting to get her labs down for her A1c, and needed to get labs ordered. Pt requested a call back to get an appt scheduled once labs are ordered. Please advise

## 2021-07-06 ENCOUNTER — Other Ambulatory Visit: Payer: Self-pay

## 2021-07-06 ENCOUNTER — Encounter: Payer: Self-pay | Admitting: Family Medicine

## 2021-07-06 ENCOUNTER — Ambulatory Visit (INDEPENDENT_AMBULATORY_CARE_PROVIDER_SITE_OTHER): Payer: Commercial Managed Care - PPO | Admitting: Family Medicine

## 2021-07-06 VITALS — BP 129/74 | HR 71 | Temp 98.4°F | Resp 16 | Wt 179.8 lb

## 2021-07-06 DIAGNOSIS — M542 Cervicalgia: Secondary | ICD-10-CM

## 2021-07-06 DIAGNOSIS — E7800 Pure hypercholesterolemia, unspecified: Secondary | ICD-10-CM | POA: Diagnosis not present

## 2021-07-06 DIAGNOSIS — D500 Iron deficiency anemia secondary to blood loss (chronic): Secondary | ICD-10-CM | POA: Diagnosis not present

## 2021-07-06 DIAGNOSIS — E782 Mixed hyperlipidemia: Secondary | ICD-10-CM

## 2021-07-06 DIAGNOSIS — R002 Palpitations: Secondary | ICD-10-CM

## 2021-07-06 DIAGNOSIS — Z23 Encounter for immunization: Secondary | ICD-10-CM

## 2021-07-06 DIAGNOSIS — I2510 Atherosclerotic heart disease of native coronary artery without angina pectoris: Secondary | ICD-10-CM

## 2021-07-06 DIAGNOSIS — I10 Essential (primary) hypertension: Secondary | ICD-10-CM

## 2021-07-06 DIAGNOSIS — Z9861 Coronary angioplasty status: Secondary | ICD-10-CM

## 2021-07-06 DIAGNOSIS — E119 Type 2 diabetes mellitus without complications: Secondary | ICD-10-CM

## 2021-07-06 DIAGNOSIS — G8929 Other chronic pain: Secondary | ICD-10-CM

## 2021-07-06 LAB — HM DIABETES EYE EXAM

## 2021-07-06 NOTE — Assessment & Plan Note (Signed)
Takes valium for neck tightness

## 2021-07-06 NOTE — Assessment & Plan Note (Signed)
Repeat blood work

## 2021-07-06 NOTE — Assessment & Plan Note (Signed)
Slight elevation; reports recent change in medication- ramipril

## 2021-07-06 NOTE — Assessment & Plan Note (Signed)
Reinforce diet and exercise plan Blood drawn today for a1c check

## 2021-07-06 NOTE — Assessment & Plan Note (Signed)
Repeat tsh check

## 2021-07-06 NOTE — Progress Notes (Signed)
Established patient visit   Patient: Cheyenne Acosta   DOB: [birth year]   50 y.o. Female  MRN: [id number] Visit Date: 07/06/2021  Today's healthcare provider: Jacky Kindle, FNP   Chief Complaint  Patient presents with   Diabetes   Hypertension   Subjective    HPI  Diabetes Mellitus Type II, Follow-up  Lab Results  Component Value Date   HGBA1C 6.7 (A) 07/22/2020   HGBA1C 6.6 (H) 03/11/2020   HGBA1C 6.5 (H) 07/24/2019   Wt Readings from Last 3 Encounters:  02/20/21 195 lb (88.5 kg)  08/03/20 186 lb (84.4 kg)  07/22/20 187 lb (84.8 kg)   Last seen for diabetes 4 months ago.  Management since then includes Presently on Metformin 1000 mg 1 tablet each morning and 1/2 tablet in the evenings. Must work on diabetic diet and progress with cardiac rehab for exercise. . She reports excellent compliance with treatment. She is not having side effects. Symptoms: No fatigue No foot ulcerations  No appetite changes No nausea  No paresthesia of the feet  No polydipsia  No polyuria No visual disturbances   No vomiting     Home blood sugar records:  not being checked  Episodes of hypoglycemia? No    Current insulin regiment: N/A Most Recent Eye Exam: 07/2021 Current diet habits: well balanced  Pertinent Labs: Lab Results  Component Value Date   CHOL 260 (H) 08/03/2020   HDL 83 08/03/2020   LDLCALC 170 (H) 08/03/2020   TRIG 50 08/03/2020   CHOLHDL 3.1 08/03/2020   Lab Results  Component Value Date   NA 138 08/03/2020   K 4.6 08/03/2020   CREATININE 0.71 08/03/2020   GFRNONAA 99 08/03/2020   GLUCOSE 87 08/03/2020     ---------------------------------------------------------------------------------------------------  Hypertension, follow-up  BP Readings from Last 3 Encounters:  02/20/21 (!) 142/78  08/03/20 (!) 148/80  07/22/20 (!) 141/67   Wt Readings from Last 3 Encounters:  02/20/21 195 lb (88.5 kg)  08/03/20 186 lb (84.4 kg)  07/22/20 187 lb (84.8  kg)     She was last seen for hypertension 4 months ago.  BP at that visit was 142/78. Management since that visit includes none.  She reports excellent compliance with treatment. She is not having side effects.  She is following a Regular diet. She is exercising. She does not smoke.  Use of agents associated with hypertension: NSAIDS.   Outside blood pressures are not being checked. Symptoms: No chest pain No chest pressure  No palpitations No syncope  No dyspnea No orthopnea  No paroxysmal nocturnal dyspnea No lower extremity edema   Pertinent labs: Lab Results  Component Value Date   CHOL 260 (H) 08/03/2020   HDL 83 08/03/2020   LDLCALC 170 (H) 08/03/2020   TRIG 50 08/03/2020   CHOLHDL 3.1 08/03/2020   Lab Results  Component Value Date   NA 138 08/03/2020   K 4.6 08/03/2020   CREATININE 0.71 08/03/2020   GFRNONAA 99 08/03/2020   GLUCOSE 87 08/03/2020     The 10-year ASCVD risk score (Arnett DK, et al., 2019) is: 9.3%   ---------------------------------------------------------------------------------------------------  Lipid/Cholesterol, Follow-up  Last lipid panel Other pertinent labs  Lab Results  Component Value Date   CHOL 260 (H) 08/03/2020   HDL 83 08/03/2020   LDLCALC 170 (H) 08/03/2020   TRIG 50 08/03/2020   CHOLHDL 3.1 08/03/2020   Lab Results  Component Value Date   ALT 11 08/03/2020   AST  14 08/03/2020   PLT 252 08/03/2020   TSH 0.694 08/03/2020     She was last seen for this 4 months ago.  Management since that visit includes none.  She reports excellent compliance with treatment. She is having side effects. Patient reports bones feeling achy  Symptoms: No chest pain No chest pressure/discomfort  No dyspnea No lower extremity edema  No numbness or tingling of extremity No orthopnea  No palpitations No paroxysmal nocturnal dyspnea  No speech difficulty No syncope   Current diet: well balanced Current exercise: walking  The  10-year ASCVD risk score (Arnett DK, et al., 2019) is: 9.3%  ---------------------------------------------------------------------------------------------------    Medications: Outpatient Medications Prior to Visit  Medication Sig   aspirin 81 MG chewable tablet Chew by mouth daily.   diazepam (VALIUM) 5 MG tablet TAKE 1 TABLET BY MOUTH EVERY 12 HOURS AS NEEDED FOR MUSCLE SPASMS.   metFORMIN (GLUCOPHAGE) 1000 MG tablet TAKE 1 TABLET BY MOUTH IN THE MORNING AND ONE-HALF TABLET IN THE EVENING   Omega-3 Fatty Acids (FISH OIL) 1200 MG CAPS Take by mouth.   prasugrel (EFFIENT) 10 MG TABS tablet Take 10 mg by mouth daily.   ramipril (ALTACE) 5 MG capsule TAKE 1 CAPSULE BY MOUTH EVERY DAY   simvastatin (ZOCOR) 40 MG tablet Take 1 tablet (40 mg total) by mouth at bedtime.   No facility-administered medications prior to visit.    Review of Systems     Objective    There were no vitals taken for this visit. {Show previous vital signs (optional):23777}  Physical Exam Vitals and nursing note reviewed.  Constitutional:      General: She is awake. She is not in acute distress.    Appearance: Normal appearance. She is well-developed, well-groomed and overweight. She is not ill-appearing, toxic-appearing or diaphoretic.  HENT:     Head: Normocephalic and atraumatic.     Jaw: There is normal jaw occlusion. No trismus, tenderness, swelling or pain on movement.     Right Ear: Hearing, tympanic membrane, ear canal and external ear normal. There is no impacted cerumen.     Left Ear: Hearing, tympanic membrane, ear canal and external ear normal. There is no impacted cerumen.     Nose: Nose normal. No congestion or rhinorrhea.     Right Turbinates: Not enlarged, swollen or pale.     Left Turbinates: Not enlarged, swollen or pale.     Right Sinus: No maxillary sinus tenderness or frontal sinus tenderness.     Left Sinus: No maxillary sinus tenderness or frontal sinus tenderness.     Mouth/Throat:      Lips: Pink.     Mouth: Mucous membranes are moist. No injury.     Tongue: No lesions.     Pharynx: Oropharynx is clear. Uvula midline. No pharyngeal swelling, oropharyngeal exudate, posterior oropharyngeal erythema or uvula swelling.     Tonsils: No tonsillar exudate or tonsillar abscesses.  Eyes:     General: Lids are normal. Lids are everted, no foreign bodies appreciated. Vision grossly intact. Gaze aligned appropriately. No allergic shiner or visual field deficit.       Right eye: No discharge.        Left eye: No discharge.     Extraocular Movements: Extraocular movements intact.     Conjunctiva/sclera: Conjunctivae normal.     Right eye: Right conjunctiva is not injected. No exudate.    Left eye: Left conjunctiva is not injected. No exudate.    Pupils: Pupils  are equal, round, and reactive to light.     Comments: Recently underwent eye exam  Neck:     Thyroid: No thyroid mass, thyromegaly or thyroid tenderness.     Vascular: No carotid bruit.     Trachea: Trachea normal.  Cardiovascular:     Rate and Rhythm: Normal rate and regular rhythm.     Pulses: Normal pulses.          Carotid pulses are 2+ on the right side and 2+ on the left side.      Radial pulses are 2+ on the right side and 2+ on the left side.       Dorsalis pedis pulses are 2+ on the right side and 2+ on the left side.       Posterior tibial pulses are 2+ on the right side and 2+ on the left side.     Heart sounds: Normal heart sounds, S1 normal and S2 normal. No murmur heard.   No friction rub. No gallop.  Pulmonary:     Effort: Pulmonary effort is normal. No respiratory distress.     Breath sounds: Normal breath sounds and air entry. No stridor. No wheezing, rhonchi or rales.  Chest:     Chest wall: No tenderness.     Comments: Breast exam deferred; discussed 'know your lemons' campaign and self exam Abdominal:     General: Abdomen is flat. Bowel sounds are normal. There is no distension.     Palpations:  Abdomen is soft. There is no mass.     Tenderness: There is no abdominal tenderness. There is no right CVA tenderness, left CVA tenderness, guarding or rebound.     Hernia: No hernia is present.  Genitourinary:    Comments: Exam deferred; denies complaints Musculoskeletal:        General: No swelling, tenderness, deformity or signs of injury. Normal range of motion.     Cervical back: Full passive range of motion without pain, normal range of motion and neck supple. No edema, rigidity or tenderness. No muscular tenderness.     Right lower leg: No edema.     Left lower leg: No edema.  Lymphadenopathy:     Cervical: No cervical adenopathy.     Right cervical: No superficial, deep or posterior cervical adenopathy.    Left cervical: No superficial, deep or posterior cervical adenopathy.  Skin:    General: Skin is warm and dry.     Capillary Refill: Capillary refill takes less than 2 seconds.     Coloration: Skin is not jaundiced or pale.     Findings: No bruising, erythema, lesion or rash.  Neurological:     General: No focal deficit present.     Mental Status: She is alert and oriented to person, place, and time. Mental status is at baseline.     GCS: GCS eye subscore is 4. GCS verbal subscore is 5. GCS motor subscore is 6.     Sensory: Sensation is intact. No sensory deficit.     Motor: Motor function is intact. No weakness.     Coordination: Coordination is intact. Coordination normal.     Gait: Gait is intact. Gait normal.  Psychiatric:        Attention and Perception: Attention and perception normal.        Mood and Affect: Mood and affect normal.        Speech: Speech normal.        Behavior: Behavior normal. Behavior is cooperative.  Thought Content: Thought content normal.        Cognition and Memory: Cognition and memory normal.        Judgment: Judgment normal.     No results found for any visits on 07/06/21.  Assessment & Plan     Problem List Items Addressed  This Visit       Cardiovascular and Mediastinum   Essential (primary) hypertension    Slight elevation; reports recent change in medication- ramipril        Endocrine   Diabetes (HCC)    Reinforce diet and exercise plan Blood drawn today for a1c check        Other   HLD (hyperlipidemia)    Repeat blood work      Chronic neck pain    Takes valium for neck tightness      Palpitations    Repeat tsh check      Other Visit Diagnoses     Need for influenza vaccination    -  Primary   Relevant Orders   Flu Vaccine QUAD 69mo+IM (Fluarix, Fluzone & Alfiuria Quad PF) (Completed)   Iron deficiency anemia due to chronic blood loss       Relevant Orders   Iron, TIBC and Ferritin Panel   CAD S/P percutaneous coronary angioplasty           Return in about 6 months (around 01/04/2022) for chonic disease management, annual examination.     Leilani Merl, FNP, have reviewed all documentation for this visit. The documentation on 07/06/21 for the exam, diagnosis, procedures, and orders are all accurate and complete.    Jacky Kindle, FNP  [facility] [phone] (phone) [phone] (fax)  [facility]

## 2021-07-07 LAB — COMPREHENSIVE METABOLIC PANEL
ALT: 8 IU/L (ref 0–32)
AST: 12 IU/L (ref 0–40)
Albumin/Globulin Ratio: 1.8 (ref 1.2–2.2)
Albumin: 4.5 g/dL (ref 3.8–4.9)
Alkaline Phosphatase: 62 IU/L (ref 44–121)
BUN/Creatinine Ratio: 11 (ref 9–23)
BUN: 8 mg/dL (ref 6–24)
Bilirubin Total: <0.2 mg/dL (ref 0.0–1.2)
CO2: 21 mmol/L (ref 20–29)
Calcium: 9.1 mg/dL (ref 8.7–10.2)
Chloride: 102 mmol/L (ref 96–106)
Creatinine, Ser: 0.74 mg/dL (ref 0.57–1.00)
Globulin, Total: 2.5 g/dL (ref 1.5–4.5)
Glucose: 86 mg/dL (ref 70–99)
Potassium: 4.2 mmol/L (ref 3.5–5.2)
Sodium: 138 mmol/L (ref 134–144)
Total Protein: 7 g/dL (ref 6.0–8.5)
eGFR: 97 mL/min/{1.73_m2} (ref >59)

## 2021-07-07 LAB — CBC WITH DIFFERENTIAL/PLATELET
Basophils Absolute: 0 10*3/uL (ref 0.0–0.2)
Basos: 0 %
EOS (ABSOLUTE): 0.3 10*3/uL (ref 0.0–0.4)
Eos: 6 %
Hematocrit: 25.7 % — ABNORMAL LOW (ref 34.0–46.6)
Hemoglobin: 7.7 g/dL — ABNORMAL LOW (ref 11.1–15.9)
Immature Grans (Abs): 0 10*3/uL (ref 0.0–0.1)
Immature Granulocytes: 0 %
Lymphocytes Absolute: 2 10*3/uL (ref 0.7–3.1)
Lymphs: 38 %
MCH: 20.2 pg — ABNORMAL LOW (ref 26.6–33.0)
MCHC: 30 g/dL — ABNORMAL LOW (ref 31.5–35.7)
MCV: 68 fL — ABNORMAL LOW (ref 79–97)
Monocytes Absolute: 0.4 10*3/uL (ref 0.1–0.9)
Monocytes: 7 %
Neutrophils Absolute: 2.5 10*3/uL (ref 1.4–7.0)
Neutrophils: 49 %
Platelets: 267 10*3/uL (ref 150–450)
RBC: 3.81 x10E6/uL (ref 3.77–5.28)
RDW: 16.5 % — ABNORMAL HIGH (ref 11.7–15.4)
WBC: 5.2 10*3/uL (ref 3.4–10.8)

## 2021-07-07 LAB — LIPID PANEL
Chol/HDL Ratio: 3 ratio (ref 0.0–4.4)
Cholesterol, Total: 207 mg/dL — ABNORMAL HIGH (ref 100–199)
HDL: 70 mg/dL (ref >39)
LDL Chol Calc (NIH): 128 mg/dL — ABNORMAL HIGH (ref 0–99)
Triglycerides: 50 mg/dL (ref 0–149)
VLDL Cholesterol Cal: 9 mg/dL (ref 5–40)

## 2021-07-07 LAB — IRON,TIBC AND FERRITIN PANEL
Ferritin: 4 ng/mL — ABNORMAL LOW (ref 15–150)
Iron Saturation: 3 % — CL (ref 15–55)
Iron: 13 ug/dL — ABNORMAL LOW (ref 27–159)
Total Iron Binding Capacity: 410 ug/dL (ref 250–450)
UIBC: 397 ug/dL (ref 131–425)

## 2021-07-07 LAB — TSH: TSH: 0.597 u[IU]/mL (ref 0.450–4.500)

## 2021-07-07 LAB — HEMOGLOBIN A1C
Est. average glucose Bld gHb Est-mCnc: 160 mg/dL
Hgb A1c MFr Bld: 7.2 % — ABNORMAL HIGH (ref 4.8–5.6)

## 2021-08-11 ENCOUNTER — Telehealth: Payer: Self-pay | Admitting: Family Medicine

## 2021-08-11 DIAGNOSIS — I10 Essential (primary) hypertension: Secondary | ICD-10-CM

## 2021-08-11 MED ORDER — RAMIPRIL 5 MG PO CAPS: ORAL_CAPSULE | ORAL | 1 refills | Status: DC

## 2021-08-11 NOTE — Telephone Encounter (Signed)
[facility] faxed refill request for the following medications:  ramipril (ALTACE) 5 MG capsule   Please advise.

## 2021-09-11 ENCOUNTER — Telehealth: Payer: Self-pay | Admitting: Family Medicine

## 2021-09-11 DIAGNOSIS — E119 Type 2 diabetes mellitus without complications: Secondary | ICD-10-CM

## 2021-09-11 MED ORDER — METFORMIN HCL 1000 MG PO TABS: ORAL_TABLET | ORAL | 2 refills | Status: DC

## 2021-09-11 NOTE — Telephone Encounter (Signed)
[facility] faxed refill request for the following medications:   metFORMIN (GLUCOPHAGE) 1000 MG tablet   Please advise.  

## 2021-10-25 ENCOUNTER — Encounter: Payer: Self-pay | Admitting: Family Medicine

## 2021-11-07 ENCOUNTER — Telehealth: Payer: Self-pay | Admitting: Family Medicine

## 2021-11-07 NOTE — Telephone Encounter (Signed)
Referral Request - Has patient seen PCP for this complaint? yes *If NO, is insurance requiring patient see PCP for this issue before PCP can refer them? Referral for which specialty:holistic doctor Preferred provider/office:N/A Reason for referral: wants to get off medications

## 2021-11-08 ENCOUNTER — Encounter: Payer: Self-pay | Admitting: Family Medicine

## 2021-11-08 NOTE — Telephone Encounter (Signed)
Patient returned call- she states her provider states she knew a provider in [place] and wanted to get that name.

## 2021-11-29 ENCOUNTER — Other Ambulatory Visit: Payer: Self-pay | Admitting: Family Medicine

## 2021-11-29 DIAGNOSIS — E119 Type 2 diabetes mellitus without complications: Secondary | ICD-10-CM

## 2021-11-29 DIAGNOSIS — M542 Cervicalgia: Secondary | ICD-10-CM

## 2021-11-29 DIAGNOSIS — G8929 Other chronic pain: Secondary | ICD-10-CM

## 2021-11-29 NOTE — Telephone Encounter (Signed)
Requested medications are due for refill today.  yes ? ?Requested medications are on the active medications list.  yes ? ?Last refill. 11/02/2020 #30 1 refill ? ?Future visit scheduled.   no ? ?Notes to clinic.  Medication refill is not delegated. ? ? ? ?Requested Prescriptions  ?Pending Prescriptions Disp Refills  ? diazepam (VALIUM) 5 MG tablet [Pharmacy Med Name: DIAZEPAM 5 MG TABLET] 30 tablet   ?  Sig: TAKE 1 TABLET BY MOUTH EVERY 12 HOURS AS NEEDED FOR MUSCLE SPASMS.  ?  ? Not Delegated - Psychiatry: Anxiolytics/Hypnotics 2 Failed - 11/29/2021  4:19 PM  ?  ?  Failed - This refill cannot be delegated  ?  ?  Failed - Urine Drug Screen completed in last 360 days  ?  ?  Passed - Patient is not pregnant  ?  ?  Passed - Valid encounter within last 6 months  ?  Recent Outpatient Visits   ? ?      ? 4 months ago Need for influenza vaccination  ? [facility] Gwyneth Sprout, FNP  ? 9 months ago CAD S/P percutaneous coronary angioplasty  ? [place], PA-C  ? 1 year ago Type 2 diabetes mellitus without complication, without long-term current use of insulin ([place])  ? [place], PA-C  ? 1 year ago Annual physical exam  ? [place], PA-C  ? 2 years ago Type 2 diabetes mellitus without complication, without long-term current use of insulin ([place])  ? [place], PA-C  ? ?  ?  ? ?  ?  ?  ?  ?

## 2021-11-30 ENCOUNTER — Encounter: Payer: Self-pay | Admitting: Family Medicine

## 2021-12-01 ENCOUNTER — Other Ambulatory Visit: Payer: Self-pay | Admitting: Family Medicine

## 2021-12-01 DIAGNOSIS — Z5320 Procedure and treatment not carried out because of patient's decision for unspecified reasons: Secondary | ICD-10-CM

## 2021-12-01 NOTE — Telephone Encounter (Signed)
Spoke with patient to schedule an appointment in the office to discuss Valium refill and for a drug screen per Merita Norton.  Patient said  we were crazy and that she wasn't no "drug dealer and she aint coming up in here for that".   She said she been getting Valium prescriptions a year at a time.  She wouldn't schedule an appt.

## 2021-12-20 ENCOUNTER — Other Ambulatory Visit: Payer: Self-pay | Admitting: Family Medicine

## 2021-12-20 DIAGNOSIS — G8929 Other chronic pain: Secondary | ICD-10-CM

## 2022-01-16 ENCOUNTER — Other Ambulatory Visit: Payer: Self-pay | Admitting: Family Medicine

## 2022-01-16 DIAGNOSIS — E119 Type 2 diabetes mellitus without complications: Secondary | ICD-10-CM

## 2022-01-16 NOTE — Telephone Encounter (Signed)
Has refill left, pharm will fill. ?Requested Prescriptions  ?Pending Prescriptions Disp Refills  ?? metFORMIN (GLUCOPHAGE) 1000 MG tablet [Pharmacy Med Name: METFORMIN HCL 1,000 MG TABLET] 45 tablet 1  ?  Sig: TAKE 1 TABLET BY MOUTH IN THE MORNING AND 1/2 TABLET IN THE EVENING. PLEASE CONTACT OFFICE TO SCHEDULE FOLLOW UP.  ?  ? Endocrinology:  Diabetes - Biguanides Failed - 01/16/2022  9:33 AM  ?  ?  Failed - HBA1C is between 0 and 7.9 and within 180 days  ?  Hgb A1c MFr Bld  ?Date Value Ref Range Status  ?07/06/2021 7.2 (H) 4.8 - 5.6 % Final  ?  Comment:  ?           Prediabetes: 5.7 - 6.4 ?         Diabetes: >6.4 ?         Glycemic control for adults with diabetes: <7.0 ?  ?   ?  ?  Failed - Valid encounter within last 6 months  ?  Recent Outpatient Visits   ?      ? 6 months ago Need for influenza vaccination  ? [facility] Gwyneth Sprout, FNP  ? 11 months ago CAD S/P percutaneous coronary angioplasty  ? [place], PA-C  ? 1 year ago Type 2 diabetes mellitus without complication, without long-term current use of insulin ([place])  ? [place], PA-C  ? 1 year ago Annual physical exam  ? [place], PA-C  ? 2 years ago Type 2 diabetes mellitus without complication, without long-term current use of insulin ([place])  ? [place], PA-C  ?  ?  ?Future Appointments   ?        ? In 1 week Gwyneth Sprout, [place], [facility]  ?  ? ?  ?  ?  Passed - Cr in normal range and within 360 days  ?  Creatinine, Ser  ?Date Value Ref Range Status  ?07/06/2021 0.74 0.57 - 1.00 mg/dL Final  ? ?Creatinine, POC  ?Date Value Ref Range Status  ?09/13/2017 NA mg/dL Final  ?   ?  ?  Passed - eGFR in normal range and within 360 days  ?  GFR calc Af Amer  ?Date Value Ref Range Status  ?08/03/2020 114 >59 mL/min/1.73 Final  ?  Comment:  ?  **In accordance with recommendations from  the NKF-ASN Task force,** ?  [facility] is in the process of updating its eGFR calculation to the ?  2021 CKD-EPI creatinine equation that estimates kidney function ?  without a race variable. ?  ? ?GFR calc non Af Amer  ?Date Value Ref Range Status  ?08/03/2020 99 >59 mL/min/1.73 Final  ? ?eGFR  ?Date Value Ref Range Status  ?07/06/2021 97 >59 mL/min/1.73 Final  ?   ?  ?  Passed - B12 Level in normal range and within 720 days  ?  Vitamin B-12  ?Date Value Ref Range Status  ?08/03/2020 1,223 232 - 1,245 pg/mL Final  ?   ?  ?  Passed - CBC within normal limits and completed in the last 12 months  ?  WBC  ?Date Value Ref Range Status  ?07/06/2021 5.2 3.4 - 10.8 x10E3/uL Final  ? ?RBC  ?Date Value Ref Range Status  ?07/06/2021 3.81 3.77 - 5.28 x10E6/uL Final  ? ?Hemoglobin  ?Date Value Ref Range Status  ?  07/06/2021 7.7 (L) 11.1 - 15.9 g/dL Final  ? ?Hematocrit  ?Date Value Ref Range Status  ?07/06/2021 25.7 (L) 34.0 - 46.6 % Final  ? ?MCHC  ?Date Value Ref Range Status  ?07/06/2021 30.0 (L) 31.5 - 35.7 g/dL Final  ? ?MCH  ?Date Value Ref Range Status  ?07/06/2021 20.2 (L) 26.6 - 33.0 pg Final  ? ?MCV  ?Date Value Ref Range Status  ?07/06/2021 68 (L) 79 - 97 fL Final  ? ?No results found for: PLTCOUNTKUC, LABPLAT, [place] ?RDW  ?Date Value Ref Range Status  ?07/06/2021 16.5 (H) 11.7 - 15.4 % Final  ? ?  ?  ?  ? ? ?

## 2022-01-16 NOTE — Telephone Encounter (Signed)
Has refill left, pharm wlll fill. Pharmacy states reached out trying to get a 90 day supply. Explained pt has not had appt lately and is coming in soon. Will see after that appt. Last fill was a curtesy fill. ?

## 2022-01-26 ENCOUNTER — Encounter: Payer: Self-pay | Admitting: Family Medicine

## 2022-01-26 ENCOUNTER — Ambulatory Visit (INDEPENDENT_AMBULATORY_CARE_PROVIDER_SITE_OTHER): Payer: Commercial Managed Care - PPO | Admitting: Family Medicine

## 2022-01-26 VITALS — BP 126/77 | HR 69 | Temp 97.8°F | Resp 16 | Wt 191.2 lb

## 2022-01-26 DIAGNOSIS — E1129 Type 2 diabetes mellitus with other diabetic kidney complication: Secondary | ICD-10-CM | POA: Insufficient documentation

## 2022-01-26 DIAGNOSIS — G8929 Other chronic pain: Secondary | ICD-10-CM

## 2022-01-26 DIAGNOSIS — R5382 Chronic fatigue, unspecified: Secondary | ICD-10-CM

## 2022-01-26 DIAGNOSIS — M542 Cervicalgia: Secondary | ICD-10-CM

## 2022-01-26 DIAGNOSIS — E1159 Type 2 diabetes mellitus with other circulatory complications: Secondary | ICD-10-CM | POA: Diagnosis not present

## 2022-01-26 DIAGNOSIS — E1169 Type 2 diabetes mellitus with other specified complication: Secondary | ICD-10-CM

## 2022-01-26 DIAGNOSIS — Z1159 Encounter for screening for other viral diseases: Secondary | ICD-10-CM | POA: Insufficient documentation

## 2022-01-26 DIAGNOSIS — E1165 Type 2 diabetes mellitus with hyperglycemia: Secondary | ICD-10-CM

## 2022-01-26 DIAGNOSIS — I152 Hypertension secondary to endocrine disorders: Secondary | ICD-10-CM | POA: Insufficient documentation

## 2022-01-26 DIAGNOSIS — E785 Hyperlipidemia, unspecified: Secondary | ICD-10-CM

## 2022-01-26 MED ORDER — DIAZEPAM 5 MG PO TABS: 5.0000 mg | ORAL_TABLET | Freq: Two times a day (BID) | ORAL | 0 refills | Status: DC | PRN

## 2022-01-26 NOTE — Assessment & Plan Note (Signed)
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

## 2022-01-26 NOTE — Assessment & Plan Note (Signed)
Chronic, stable ?Repeat A1c ?On statin ?On Ace ?Will send for eye records ?Foot exam and micro completed today ?Continue metformin, 1000 mg Q AM, 500 mg Q PM ?Will revise based on A1c and complaints of GI concern with use of Rx  ?

## 2022-01-26 NOTE — Progress Notes (Signed)
?  ? ?[employer] as a Education administrator for Gwyneth Sprout, FNP.,have documented all relevant documentation on the behalf of Gwyneth Sprout, FNP,as directed by  Gwyneth Sprout, FNP while in the presence of Gwyneth Sprout, FNP.  ? ?Established patient visit ? ?Patient: Cheyenne Acosta   DOB: [birth year]   51 y.o. Female  MRN: [id number] ?Visit Date: 01/26/2022 ? ?Today's healthcare provider: Gwyneth Sprout, FNP  ?Re Introduced to nurse practitioner role and practice setting.  All questions answered.  Discussed provider/patient relationship and expectations. ? ?Chief Complaint  ?Patient presents with  ? Diabetes  ? Hypertension  ? Hyperlipidemia  ? Follow-up  ?  Patient would like to address refill today on Clonazepam for chronic neck pain.  ? ?Subjective  ?  ?HPI ?HPI   ? ? Follow-up   ? Additional comments: Patient would like to address refill today on Clonazepam for chronic neck pain. ? ?  ?  ?Last edited by Minette Headland, CMA on 01/26/2022  9:11 AM.  ?  ?  ?Diabetes Mellitus Type II, follow-up ? ?Lab Results  ?Component Value Date  ? HGBA1C 7.2 (H) 07/06/2021  ? HGBA1C 6.7 (A) 07/22/2020  ? HGBA1C 6.6 (H) 03/11/2020  ? ?Last seen for diabetes 6 months ago.  ?Management since then includes continuing the same treatment. ?She reports good compliance with treatment. ?She is having side effects. Patient reports dizziness, upset stomach, diarrhea ? ?Home blood sugar records:  not checked ? ?Episodes of hypoglycemia? No N/A ?  ?Current insulin regiment: N/A ?Most Recent Eye Exam: UTD  ? ?--------------------------------------------------------------------------------------------------- ?Hypertension, follow-up ? ?BP Readings from Last 3 Encounters:  ?01/26/22 126/77  ?07/06/21 129/74  ?02/20/21 (!) 142/78  ? Wt Readings from Last 3 Encounters:  ?01/26/22 191 lb 3.2 oz (86.7 kg)  ?07/06/21 179 lb 12.8 oz (81.6 kg)  ?02/20/21 195 lb (88.5 kg)  ?  ? ?She was last seen for hypertension 6 months ago.  ?BP at that visit  was 129/74. ?Management since that visit includes none. ?She reports excellent compliance with treatment. ?She is not having side effects.  ?She is exercising., walking 54mles a day ?She is not adherent to low salt diet.   ?Outside blood pressures are checked. ? ?She does not smoke. ? ?Use of agents associated with hypertension: NSAIDS.  ? ?--------------------------------------------------------------------------------------------------- ?Lipid/Cholesterol, follow-up ? ?Last Lipid Panel: ?Lab Results  ?Component Value Date  ? CHOL 207 (H) 07/06/2021  ? LDLCALC 128 (H) 07/06/2021  ? HDL 70 07/06/2021  ? TRIG 50 07/06/2021  ? ? ?She was last seen for this 6 months ago.  ?Management since that visit includes none. ? ?She reports good compliance with treatment. ?She is having side effects. Patient reports stiffness and bones ache/joint pain. ? ?Symptoms: ?No appetite changes No foot ulcerations  ?No chest pain No chest pressure/discomfort  ?No dyspnea No orthopnea  ?No fatigue No lower extremity edema  ?No palpitations No paroxysmal nocturnal dyspnea  ?No nausea No numbness or tingling of extremity  ?No polydipsia No polyuria  ?No speech difficulty No syncope  ? ?She is following a Regular diet. ?Current exercise: walking ? ?Last metabolic panel ?Lab Results  ?Component Value Date  ? GLUCOSE 86 07/06/2021  ? NA 138 07/06/2021  ? K 4.2 07/06/2021  ? BUN 8 07/06/2021  ? CREATININE 0.74 07/06/2021  ? EGFR 97 07/06/2021  ? GFRNONAA 99 08/03/2020  ? CALCIUM 9.1 07/06/2021  ? AST 12 07/06/2021  ? ALT 8 07/06/2021  ? ?  The 10-year ASCVD risk score (Arnett DK, et al., 2019) is: 7.5% ? ?---------------------------------------------------------------------------------------------------  ? ?Medications: ?Outpatient Medications Prior to Visit  ?Medication Sig  ? aspirin 81 MG chewable tablet Chew by mouth daily.  ? metFORMIN (GLUCOPHAGE) 1000 MG tablet Take 1 tablet by mouth int he morning and one half tablet in the evening. Please  contact office to schedule follow up.  ? Omega-3 Fatty Acids (FISH OIL) 1200 MG CAPS Take by mouth.  ? ramipril (ALTACE) 5 MG capsule TAKE 1 CAPSULE BY MOUTH EVERY DAY  ? simvastatin (ZOCOR) 40 MG tablet Take 1 tablet (40 mg total) by mouth at bedtime.  ? [DISCONTINUED] diazepam (VALIUM) 5 MG tablet TAKE 1 TABLET BY MOUTH EVERY 12 HOURS AS NEEDED FOR MUSCLE SPASMS.  ? prasugrel (EFFIENT) 10 MG TABS tablet Take 10 mg by mouth daily. (Patient not taking: Reported on 01/26/2022)  ? ?No facility-administered medications prior to visit.  ? ? ?Review of Systems ? ?  Objective  ?  ?BP 126/77   Pulse 69   Temp 97.8 ?F (36.6 ?C) (Oral)   Resp 16   Wt 191 lb 3.2 oz (86.7 kg)   BMI 36.13 kg/m?  ? ? ?Physical Exam ?Vitals and nursing note reviewed.  ?Constitutional:   ?   General: She is not in acute distress. ?   Appearance: Normal appearance. She is obese. She is not ill-appearing, toxic-appearing or diaphoretic.  ?HENT:  ?   Head: Normocephalic and atraumatic.  ?Cardiovascular:  ?   Rate and Rhythm: Normal rate and regular rhythm.  ?   Pulses: Normal pulses.  ?   Heart sounds: Normal heart sounds. No murmur heard. ?  No friction rub. No gallop.  ?Pulmonary:  ?   Effort: Pulmonary effort is normal. No respiratory distress.  ?   Breath sounds: Normal breath sounds. No stridor. No wheezing, rhonchi or rales.  ?Chest:  ?   Chest wall: No tenderness.  ?Abdominal:  ?   General: Bowel sounds are normal.  ?   Palpations: Abdomen is soft.  ?Musculoskeletal:     ?   General: No swelling, deformity or signs of injury. Normal range of motion.  ?   Cervical back: Normal range of motion. Tenderness present.  ?   Right lower leg: No edema.  ?   Left lower leg: No edema.  ?Skin: ?   General: Skin is warm and dry.  ?   Capillary Refill: Capillary refill takes less than 2 seconds.  ?   Coloration: Skin is not jaundiced or pale.  ?   Findings: No bruising, erythema, lesion or rash.  ?Neurological:  ?   General: No focal deficit present.  ?    Mental Status: She is alert and oriented to person, place, and time. Mental status is at baseline.  ?   Cranial Nerves: No cranial nerve deficit.  ?   Sensory: No sensory deficit.  ?   Motor: No weakness.  ?   Coordination: Coordination normal.  ?Psychiatric:     ?   Mood and Affect: Mood normal.     ?   Behavior: Behavior normal.     ?   Thought Content: Thought content normal.     ?   Judgment: Judgment normal.  ?  ? ? ?No results found for any visits on 01/26/22. ? Assessment & Plan  ?  ? ?Problem List Items Addressed This Visit   ? ?  ? Cardiovascular and Mediastinum  ? Hypertension  associated with diabetes ([place])  ?  Chronic, stable ?Denies CP ?Denies SOB/ DOE ?Denies low blood pressure/hypotension ?Denies vision changes ?No LE Edema noted on exam ?Continue medication, Altace 5 mg ?Denies side effects ?RTC for CPE ?Seek emergent care if you develop chest pain or chest pressure ? ?  ?  ?  ? Endocrine  ? Hyperlipidemia associated with type 2 diabetes mellitus ([place])  ?  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 ?  ?  ? Relevant Orders  ? Lipid panel  ? Type 2 diabetes mellitus with hyperglycemia, without long-term current use of insulin ([facility]) - Primary  ?  Chronic, stable ?Repeat A1c ?On statin ?On Ace ?Will send for eye records ?Foot exam and micro completed today ?Continue metformin, 1000 mg Q AM, 500 mg Q PM ?Will revise based on A1c and complaints of GI concern with use of Rx  ? ?  ?  ? Relevant Orders  ? Comprehensive metabolic panel  ? Hemoglobin A1c  ? Urine Microalbumin w/creat. ratio  ?  ? Other  ? Chronic fatigue  ?  Chronic, exacerbated ?will check CBC and TSH in addition to A1c and CMP ? ?  ?  ? Relevant Orders  ? CBC with Differential/Platelet  ? TSH + free T4  ? Chronic neck pain  ?  Chronic, intermittent complaints ?Request for PRN valium to assist with neck tightness ?PDMP reviewed  ? ?  ?  ? Relevant Medications  ? diazepam (VALIUM) 5 MG  tablet  ? Encounter for hepatitis C screening test for low risk patient  ?  Low risk screen ?Treatable, and curable. If left untreated Hep C can lead to cirrhosis and liver failure. ?Encourage routine testin

## 2022-01-26 NOTE — Assessment & Plan Note (Signed)
Chronic, exacerbated ?will check CBC and TSH in addition to A1c and CMP ?

## 2022-01-26 NOTE — Assessment & Plan Note (Signed)
Chronic, intermittent complaints ?Request for PRN valium to assist with neck tightness ?PDMP reviewed  ?

## 2022-01-26 NOTE — Assessment & Plan Note (Signed)
Low risk screen Treatable, and curable. If left untreated Hep C can lead to cirrhosis and liver failure. Encourage routine testing; recommend repeat testing if risk factors change.  

## 2022-01-26 NOTE — Assessment & Plan Note (Signed)
Chronic, stable ?Denies CP ?Denies SOB/ DOE ?Denies low blood pressure/hypotension ?Denies vision changes ?No LE Edema noted on exam ?Continue medication, Altace 5 mg ?Denies side effects ?RTC for CPE ?Seek emergent care if you develop chest pain or chest pressure ? ?

## 2022-01-27 LAB — COMPREHENSIVE METABOLIC PANEL
ALT: 11 IU/L (ref 0–32)
AST: 13 IU/L (ref 0–40)
Albumin/Globulin Ratio: 1.5 (ref 1.2–2.2)
Albumin: 4.3 g/dL (ref 3.8–4.9)
Alkaline Phosphatase: 65 IU/L (ref 44–121)
BUN/Creatinine Ratio: 12 (ref 9–23)
BUN: 9 mg/dL (ref 6–24)
Bilirubin Total: 0.2 mg/dL (ref 0.0–1.2)
CO2: 21 mmol/L (ref 20–29)
Calcium: 9.1 mg/dL (ref 8.7–10.2)
Chloride: 104 mmol/L (ref 96–106)
Creatinine, Ser: 0.73 mg/dL (ref 0.57–1.00)
Globulin, Total: 2.9 g/dL (ref 1.5–4.5)
Glucose: 131 mg/dL — ABNORMAL HIGH (ref 70–99)
Potassium: 4.5 mmol/L (ref 3.5–5.2)
Sodium: 140 mmol/L (ref 134–144)
Total Protein: 7.2 g/dL (ref 6.0–8.5)
eGFR: 98 mL/min/{1.73_m2} (ref >59)

## 2022-01-27 LAB — CBC WITH DIFFERENTIAL/PLATELET
Basophils Absolute: 0 10*3/uL (ref 0.0–0.2)
Basos: 1 %
EOS (ABSOLUTE): 0.2 10*3/uL (ref 0.0–0.4)
Eos: 5 %
Hematocrit: 29 % — ABNORMAL LOW (ref 34.0–46.6)
Hemoglobin: 8.3 g/dL — ABNORMAL LOW (ref 11.1–15.9)
Immature Grans (Abs): 0 10*3/uL (ref 0.0–0.1)
Immature Granulocytes: 0 %
Lymphocytes Absolute: 1.9 10*3/uL (ref 0.7–3.1)
Lymphs: 42 %
MCH: 20 pg — ABNORMAL LOW (ref 26.6–33.0)
MCHC: 28.6 g/dL — ABNORMAL LOW (ref 31.5–35.7)
MCV: 70 fL — ABNORMAL LOW (ref 79–97)
Monocytes Absolute: 0.4 10*3/uL (ref 0.1–0.9)
Monocytes: 8 %
Neutrophils Absolute: 2.1 10*3/uL (ref 1.4–7.0)
Neutrophils: 44 %
Platelets: 239 10*3/uL (ref 150–450)
RBC: 4.16 x10E6/uL (ref 3.77–5.28)
RDW: 17.1 % — ABNORMAL HIGH (ref 11.7–15.4)
WBC: 4.7 10*3/uL (ref 3.4–10.8)

## 2022-01-27 LAB — LIPID PANEL
Chol/HDL Ratio: 2.5 ratio (ref 0.0–4.4)
Cholesterol, Total: 195 mg/dL (ref 100–199)
HDL: 77 mg/dL (ref >39)
LDL Chol Calc (NIH): 110 mg/dL — ABNORMAL HIGH (ref 0–99)
Triglycerides: 40 mg/dL (ref 0–149)
VLDL Cholesterol Cal: 8 mg/dL (ref 5–40)

## 2022-01-27 LAB — MICROALBUMIN / CREATININE URINE RATIO
Creatinine, Urine: 10.7 mg/dL
Microalb/Creat Ratio: <28 mg/g creat (ref 0–29)
Microalbumin, Urine: <3 ug/mL

## 2022-01-27 LAB — HEMOGLOBIN A1C
Est. average glucose Bld gHb Est-mCnc: 174 mg/dL
Hgb A1c MFr Bld: 7.7 % — ABNORMAL HIGH (ref 4.8–5.6)

## 2022-01-27 LAB — TSH+FREE T4
Free T4: 1.36 ng/dL (ref 0.82–1.77)
TSH: 1.08 u[IU]/mL (ref 0.450–4.500)

## 2022-01-27 LAB — HEPATITIS C ANTIBODY: Hep C Virus Ab: NONREACTIVE

## 2022-01-29 ENCOUNTER — Other Ambulatory Visit: Payer: Self-pay | Admitting: Family Medicine

## 2022-01-29 ENCOUNTER — Encounter: Payer: Self-pay | Admitting: Family Medicine

## 2022-01-29 DIAGNOSIS — D509 Iron deficiency anemia, unspecified: Secondary | ICD-10-CM

## 2022-01-29 DIAGNOSIS — E1165 Type 2 diabetes mellitus with hyperglycemia: Secondary | ICD-10-CM

## 2022-01-29 MED ORDER — METFORMIN HCL ER 500 MG PO TB24: 1000.0000 mg | ORAL_TABLET | Freq: Two times a day (BID) | ORAL | 0 refills | Status: DC

## 2022-01-30 ENCOUNTER — Encounter: Payer: Self-pay | Admitting: Family Medicine

## 2022-02-01 ENCOUNTER — Telehealth: Payer: Self-pay

## 2022-02-01 NOTE — Telephone Encounter (Signed)
Copied from CRM [id number]. Topic: General - Other ?>> Feb 01, 2022  9:43 AM Gaetana Michaelis A wrote: ?Reason for CRM: The patient would like to be under the care of Prov. Drubel or Dr Sherrie Mustache  ? ?Pease contact the patient to further discuss PCP reassignment ?

## 2022-02-05 ENCOUNTER — Other Ambulatory Visit: Payer: Self-pay | Admitting: Family Medicine

## 2022-02-05 DIAGNOSIS — Z1231 Encounter for screening mammogram for malignant neoplasm of breast: Secondary | ICD-10-CM

## 2022-02-05 MED ORDER — ROSUVASTATIN CALCIUM 40 MG PO TABS: 40.0000 mg | ORAL_TABLET | Freq: Every day | ORAL | 3 refills | Status: DC

## 2022-02-21 ENCOUNTER — Other Ambulatory Visit: Payer: Self-pay | Admitting: Family Medicine

## 2022-02-21 DIAGNOSIS — I10 Essential (primary) hypertension: Secondary | ICD-10-CM

## 2022-02-21 DIAGNOSIS — E119 Type 2 diabetes mellitus without complications: Secondary | ICD-10-CM

## 2022-05-18 ENCOUNTER — Ambulatory Visit: Payer: Commercial Managed Care - PPO | Admitting: Physician Assistant

## 2022-05-18 ENCOUNTER — Ambulatory Visit: Payer: Commercial Managed Care - PPO | Admitting: Family Medicine

## 2022-05-25 ENCOUNTER — Other Ambulatory Visit: Payer: Self-pay | Admitting: Family Medicine

## 2022-05-25 DIAGNOSIS — E119 Type 2 diabetes mellitus without complications: Secondary | ICD-10-CM

## 2022-05-28 NOTE — Telephone Encounter (Signed)
Refused metformin 1,000 mg refill request because dose was changed to 500 mg on 01/29/2022.

## 2022-06-19 ENCOUNTER — Ambulatory Visit: Payer: Self-pay | Admitting: *Deleted

## 2022-06-19 ENCOUNTER — Ambulatory Visit: Payer: Commercial Managed Care - PPO | Admitting: Physician Assistant

## 2022-06-19 VITALS — BP 137/76 | HR 72 | Temp 98.2°F | Wt 184.0 lb

## 2022-06-19 DIAGNOSIS — R319 Hematuria, unspecified: Secondary | ICD-10-CM | POA: Diagnosis not present

## 2022-06-19 DIAGNOSIS — D509 Iron deficiency anemia, unspecified: Secondary | ICD-10-CM

## 2022-06-19 DIAGNOSIS — R82998 Other abnormal findings in urine: Secondary | ICD-10-CM

## 2022-06-19 LAB — POCT URINALYSIS DIPSTICK
Bilirubin, UA: NEGATIVE
Blood, UA: NEGATIVE
Glucose, UA: NEGATIVE
Ketones, UA: NEGATIVE
Leukocytes, UA: NEGATIVE
Nitrite, UA: NEGATIVE
Protein, UA: NEGATIVE
Spec Grav, UA: <=1.005 — AB (ref 1.010–1.025)
Urobilinogen, UA: 0.2 E.U./dL
pH, UA: 7 (ref 5.0–8.0)

## 2022-06-19 NOTE — Progress Notes (Signed)
1   I,Elena D DeSanto,acting as a Neurosurgeon for [employer], PA-C.,have documented all relevant documentation on the behalf of Debera Lat, PA-C,as directed by  [employer], PA-C while in the presence of [employer], PA-C.    Established patient visit   Patient: Cheyenne Acosta   DOB: Dec 10, 1970   51 y.o. Female  MRN: [id number] Visit Date: 06/19/2022  Today's healthcare provider: Debera Lat, PA-C   No chief complaint on file.  Subjective    HPI  Urinary symptoms  Patient is a 51 year old female who presents for evaluation of possible urinary tract infection.  Patient has been juicing with beet juice.  She states she had noticed a red tinge to her urine.  She has been 24 hours since having the last beet juice and still noticed some tinge today.       Medications: Outpatient Medications Prior to Visit  Medication Sig   aspirin 81 MG chewable tablet Chew by mouth daily.   diazepam (VALIUM) 5 MG tablet Take 1 tablet (5 mg total) by mouth every 12 (twelve) hours as needed for muscle spasms.   metFORMIN (GLUCOPHAGE-XR) 500 MG 24 hr tablet Take 2 tablets (1,000 mg total) by mouth 2 (two) times daily after a meal.   Omega-3 Fatty Acids (FISH OIL) 1200 MG CAPS Take by mouth.   prasugrel (EFFIENT) 10 MG TABS tablet Take 10 mg by mouth daily.   ramipril (ALTACE) 5 MG capsule TAKE 1 CAPSULE BY MOUTH EVERY DAY   rosuvastatin (CRESTOR) 40 MG tablet Take 1 tablet (40 mg total) by mouth daily.   No facility-administered medications prior to visit.    Review of Systems  Constitutional:  Positive for fatigue. Negative for fever.  Genitourinary:  Negative for decreased urine volume, difficulty urinating, dysuria, enuresis, flank pain, frequency, hematuria, menstrual problem, pelvic pain, urgency, vaginal bleeding, vaginal discharge and vaginal pain.       Objective    BP 137/76 (BP Location: Left Arm, Patient Position: Sitting, Cuff Size: Normal)   Pulse 72   Temp 98.2 F (36.8  C) (Oral)   Wt 184 lb (83.5 kg)   SpO2 100%   BMI 34.77 kg/m    Physical Exam Vitals reviewed.  Constitutional:      General: She is not in acute distress.    Appearance: Normal appearance. She is well-developed. She is obese. She is not diaphoretic.  HENT:     Head: Normocephalic and atraumatic.     Nose: Nose normal.  Eyes:     General: No scleral icterus.    Extraocular Movements: Extraocular movements intact.     Conjunctiva/sclera: Conjunctivae normal.  Neck:     Thyroid: No thyromegaly.  Cardiovascular:     Rate and Rhythm: Normal rate and regular rhythm.     Pulses: Normal pulses.     Heart sounds: Normal heart sounds. No murmur heard. Pulmonary:     Effort: Pulmonary effort is normal. No respiratory distress.     Breath sounds: Normal breath sounds. No wheezing, rhonchi or rales.  Musculoskeletal:     Cervical back: Neck supple.     Right lower leg: No edema.     Left lower leg: No edema.  Lymphadenopathy:     Cervical: No cervical adenopathy.  Skin:    General: Skin is warm and dry.     Findings: No rash.  Neurological:     General: No focal deficit present.     Mental Status: She is alert and  oriented to person, place, and time. Mental status is at baseline.  Psychiatric:        Behavior: Behavior normal.        Thought Content: Thought content normal.        Judgment: Judgment normal.      Results for orders placed or performed in visit on 06/19/22  POCT urinalysis dipstick  Result Value Ref Range   Color, UA light yellow    Clarity, UA clear    Glucose, UA Negative Negative   Bilirubin, UA neg    Ketones, UA neg    Spec Grav, UA <=1.005 (A) 1.010 - 1.025   Blood, UA neg    pH, UA 7.0 5.0 - 8.0   Protein, UA Negative Negative   Urobilinogen, UA 0.2 0.2 or 1.0 E.U./dL   Nitrite, UA neg    Leukocytes, UA Negative Negative   Appearance     Odor      Assessment & Plan     1. Red-colored urine Could be dietary changes/beet juice - POCT  urinalysis dipstick negative for blood  2. Iron deficiency anemia, unspecified iron deficiency anemia type Hgb level from  4 mo ago was 8.3 Has been having fatigue Highly advised to start on iron supplements and iron-enrich meals/foods Last referral was placed in May of 2023 but pt prefers to take iron supplements Requested a referral to Hematology today - Ambulatory referral to Hematology / Oncology  3. Hematuria, unspecified type Per pt request/for reassurance: - Urinalysis, microscopic only  FU PRN .     The patient was advised to call back or seek an in-person evaluation if the symptoms worsen or if the condition fails to improve as anticipated.  I discussed the assessment and treatment plan with the patient. The patient was provided an opportunity to ask questions and all were answered. The patient agreed with the plan and demonstrated an understanding of the instructions.  The entirety of the information documented in the History of Present Illness, Review of Systems and Physical Exam were personally obtained by me. Portions of this information were initially documented by the CMA and reviewed by me for thoroughness and accuracy.  Portions of this note were created using dictation software and may contain typographical errors.        Total encounter time more than 30 minutes  Greater than 50% was spent in counseling and coordination of care with the patient  Elberta Leatherwood  [facility] [phone] (phone) [phone] (fax)  [place]

## 2022-06-19 NOTE — Telephone Encounter (Signed)
Summary: abnormal bleeding   Pt called back and wants to come in so her urine can be checked. Appt made for 10:4 this morning.   ----- Message from Sharene Skeans sent at 06/19/2022  9:12 AM EDT -----  Pt called to get a referral for hematology / she has had one before and stated it was for abnormal bleeding / she asked if the referral can be placed asap / please advise if needed       Chief Complaint: blood in urine Symptoms: blood streaks in urine Frequency: every urination 3 weeks Pertinent Negatives: Patient denies fever or pain Disposition: [] ED /[] Urgent Care (no appt availability in office) / [x] Appointment(In office/virtual)/ []  [facility]/ [] Home Care/ [] Refused Recommended Disposition /[]  Mobile Bus/ []  Follow-up with PCP Additional Notes: Pt already has appt for today, she is anemic therefore worried with already being anemic and now having blood in urine and she is more tired that usual.   Reason for Disposition  All other urine symptoms  Answer Assessment - Initial Assessment Questions 1. SYMPTOM: "What's the main symptom you're concerned about?" (e.g., frequency, incontinence)     Blood in urine and she is already anemic 2. ONSET: "When did the  bloody urine  start?"     3 weeks 3. PAIN: "Is there any pain?" If Yes, ask: "How bad is it?" (Scale: 1-10; mild, moderate, severe)     no 4. CAUSE: "What do you think is causing the symptoms?"     Unknown but has anemia 5. OTHER SYMPTOMS: "Do you have any other symptoms?" (e.g., blood in urine, fever, flank pain, pain with urination)     fever 6. PREGNANCY: "Is there any chance you are pregnant?" "When was your last menstrual period?"     no  Protocols used: Urinary Symptoms-A-AH

## 2022-06-20 LAB — URINALYSIS, MICROSCOPIC ONLY
Casts: NONE SEEN /lpf
RBC, Urine: NONE SEEN /hpf (ref 0–2)

## 2022-06-20 LAB — SPECIMEN STATUS REPORT

## 2022-06-20 NOTE — Progress Notes (Signed)
[place] ,   Your labwork results all are within normal limits. However, if you develop urinary symptoms, abdominal pain please, return back for reevaluation.   Any questions please reach out to the office or message me on MyChart!  Best, Mardene Speak, PA-C

## 2022-07-11 ENCOUNTER — Ambulatory Visit (INDEPENDENT_AMBULATORY_CARE_PROVIDER_SITE_OTHER): Payer: Commercial Managed Care - PPO | Admitting: Physician Assistant

## 2022-07-11 ENCOUNTER — Encounter: Payer: Self-pay | Admitting: Physician Assistant

## 2022-07-11 VITALS — BP 151/85 | HR 80 | Temp 98.2°F | Resp 14 | Ht 60.98 in | Wt 182.0 lb

## 2022-07-11 DIAGNOSIS — Z23 Encounter for immunization: Secondary | ICD-10-CM

## 2022-07-11 DIAGNOSIS — E1165 Type 2 diabetes mellitus with hyperglycemia: Secondary | ICD-10-CM

## 2022-07-11 DIAGNOSIS — Z0000 Encounter for general adult medical examination without abnormal findings: Secondary | ICD-10-CM | POA: Diagnosis not present

## 2022-07-11 DIAGNOSIS — D509 Iron deficiency anemia, unspecified: Secondary | ICD-10-CM | POA: Diagnosis not present

## 2022-07-11 DIAGNOSIS — I152 Hypertension secondary to endocrine disorders: Secondary | ICD-10-CM

## 2022-07-11 DIAGNOSIS — E785 Hyperlipidemia, unspecified: Secondary | ICD-10-CM

## 2022-07-11 DIAGNOSIS — E1169 Type 2 diabetes mellitus with other specified complication: Secondary | ICD-10-CM

## 2022-07-11 DIAGNOSIS — E1159 Type 2 diabetes mellitus with other circulatory complications: Secondary | ICD-10-CM

## 2022-07-11 MED ORDER — METFORMIN HCL ER 500 MG PO TB24: 1000.0000 mg | ORAL_TABLET | Freq: Two times a day (BID) | ORAL | 1 refills | Status: DC

## 2022-07-11 MED ORDER — RAMIPRIL 2.5 MG PO CAPS: 2.5000 mg | ORAL_CAPSULE | Freq: Every day | ORAL | 1 refills | Status: DC

## 2022-07-11 NOTE — Assessment & Plan Note (Signed)
LDL goal < 70 Repeat fasting lipids today Managed with crestor 40 mg  The 10-year ASCVD risk score (Arnett DK, et al., 2019) is: 14.8%

## 2022-07-11 NOTE — Progress Notes (Signed)
I,Roshena L Chambers,acting as a scribe for [employer], PA-C.,have documented all relevant documentation on the behalf of [name], PA-C,as directed by  [name], PA-C while in the presence of [name], PA-C.    Complete physical exam   Patient: Cheyenne Acosta   DOB: 05-07-1971   51 y.o. Female  MRN: [id number] Visit Date: 07/11/2022  Today's healthcare provider: [name], PA-C   Chief Complaint  Patient presents with   Annual Exam   Subjective    Cheyenne Acosta is a 51 y.o. female who presents today for a complete physical exam.  She reports consuming a general diet. The patient does not participate in regular exercise at present. She generally feels fairly well. She reports sleeping fairly well. She does not have additional problems to discuss today.  HPI   Past Medical History:  Diagnosis Date   Diabetes mellitus without complication ([place])    Hyperlipidemia    Hypertension    Past Surgical History:  Procedure Laterality Date   CESAREAN SECTION     Social History   Socioeconomic History   Marital status: Married    Spouse name: Not on file   Number of children: Not on file   Years of education: Not on file   Highest education level: Not on file  Occupational History   Not on file  Tobacco Use   Smoking status: Never   Smokeless tobacco: Never  Substance and Sexual Activity   Alcohol use: No    Alcohol/week: 0.0 standard drinks of alcohol   Drug use: No   Sexual activity: Not on file  Other Topics Concern   Not on file  Social History Narrative   Not on file   Social Determinants of Health   Financial Resource Strain: Not on file  Food Insecurity: Not on file  Transportation Needs: Not on file  Physical Activity: Not on file  Stress: Not on file  Social Connections: Not on file  Intimate Partner Violence: Not on file   Family Status  Relation Name Status   Mother  Deceased at age 109   Father  Deceased at age 31    Son  Alive   [place]  Deceased at age 56   Family History  Problem Relation Age of Onset   Diabetes Mother    Heart attack Mother 54   Prostate cancer Father    Hypertension Father    Healthy Son    Allergies  Allergen Reactions   Latex    Shellfish Allergy    Aspirin     Abdominal discomfort   Sulfa Antibiotics Rash    Patient Care Team: [name], PA-C as PCP - General (Physician Assistant) Pa, [place] [facility]   Medications: Outpatient Medications Prior to Visit  Medication Sig   aspirin 81 MG chewable tablet Chew by mouth daily.   diazepam (VALIUM) 5 MG tablet Take 1 tablet (5 mg total) by mouth every 12 (twelve) hours as needed for muscle spasms.   Omega-3 Fatty Acids (FISH OIL) 1200 MG CAPS Take by mouth.   prasugrel (EFFIENT) 10 MG TABS tablet Take 10 mg by mouth daily.   rosuvastatin (CRESTOR) 40 MG tablet Take 1 tablet (40 mg total) by mouth daily.   [DISCONTINUED] metFORMIN (GLUCOPHAGE-XR) 500 MG 24 hr tablet Take 2 tablets (1,000 mg total) by mouth 2 (two) times daily after a meal.   [DISCONTINUED] ramipril (ALTACE) 5 MG capsule TAKE 1 CAPSULE BY MOUTH EVERY DAY  No facility-administered medications prior to visit.    Review of Systems  Constitutional:  Negative for chills, fatigue and fever.  HENT:  Negative for congestion, ear pain, rhinorrhea, sneezing and sore throat.   Eyes: Negative.  Negative for pain and redness.  Respiratory:  Negative for cough, shortness of breath and wheezing.   Cardiovascular:  Negative for chest pain and leg swelling.  Gastrointestinal:  Negative for abdominal pain, blood in stool, constipation, diarrhea and nausea.  Endocrine: Negative for polydipsia and polyphagia.  Genitourinary: Negative.  Negative for dysuria, flank pain, hematuria, pelvic pain, vaginal bleeding and vaginal discharge.  Musculoskeletal:  Negative for arthralgias, back pain, gait problem and joint swelling.  Skin:  Negative for rash.  Neurological:  Negative.  Negative for dizziness, tremors, seizures, weakness, light-headedness, numbness and headaches.  Hematological:  Negative for adenopathy.  Psychiatric/Behavioral: Negative.  Negative for behavioral problems, confusion and dysphoric mood. The patient is not nervous/anxious and is not hyperactive.     Objective    BP (!) 151/85 (BP Location: Right Arm, Patient Position: Sitting, Cuff Size: Large)   Pulse 80   Temp 98.2 F (36.8 C) (Oral)   Resp 14   Ht 5' 0.98" (1.549 m)   Wt 182 lb (82.6 kg)   LMP  (Within Months)   SpO2 100% Comment: room air  BMI 34.41 kg/m   Physical Exam Constitutional:      General: She is awake.     Appearance: She is well-developed. She is not ill-appearing.  HENT:     Head: Normocephalic.     Right Ear: Tympanic membrane normal.     Left Ear: Tympanic membrane normal.     Nose: Nose normal. No congestion or rhinorrhea.     Mouth/Throat:     Pharynx: No oropharyngeal exudate or posterior oropharyngeal erythema.  Eyes:     Conjunctiva/sclera: Conjunctivae normal.     Pupils: Pupils are equal, round, and reactive to light.  Neck:     Thyroid: No thyroid mass or thyromegaly.  Cardiovascular:     Rate and Rhythm: Normal rate and regular rhythm.     Heart sounds: Normal heart sounds.  Pulmonary:     Effort: Pulmonary effort is normal.     Breath sounds: Normal breath sounds.  Abdominal:     Palpations: Abdomen is soft.     Tenderness: There is no abdominal tenderness.  Musculoskeletal:     Right lower leg: No swelling. No edema.     Left lower leg: No swelling. No edema.  Lymphadenopathy:     Cervical: No cervical adenopathy.  Skin:    General: Skin is warm.  Neurological:     Mental Status: She is alert and oriented to person, place, and time.  Psychiatric:        Attention and Perception: Attention normal.        Mood and Affect: Mood normal.        Speech: Speech normal.        Behavior: Behavior normal. Behavior is cooperative.      Last depression screening scores    01/26/2022    9:17 AM 07/22/2020    1:33 PM 03/11/2020    8:31 AM  PHQ 2/9 Scores  PHQ - 2 Score 0 0 0  PHQ- 9 Score 0 0    Last fall risk screening    03/11/2020    8:31 AM  [place] in the past year? 0  Number falls in past  yr: 0  Injury with Fall? 0   Last Audit-C alcohol use screening    07/22/2020    1:33 PM  Alcohol Use Disorder Test (AUDIT)  2. How many drinks containing alcohol do you have on a typical day when you are drinking? 0  3. How often do you have six or more drinks on one occasion? 0  Alcohol Brief Interventions/Follow-up AUDIT Score <7 follow-up not indicated   A score of 3 or more in women, and 4 or more in men indicates increased risk for alcohol abuse, EXCEPT if all of the points are from question 1   No results found for any visits on 07/11/22.  Assessment & Plan    Routine Health Maintenance and Physical Exam  Exercise Activities and Dietary recommendations --balanced diet high in fiber and protein, low in sugars, carbs, fats. --physical activity/exercise 30 minutes 3-5 times a week    Immunization History  Administered Date(s) Administered   DTaP 01/06/1969, 02/10/1969, 09/07/1970, 03/23/1974   Hepatitis B 05/30/1999   IPV 01/06/1969, 02/10/1969, 09/07/1970, 03/23/1974   Influenza Split 08/12/2012   Influenza,inj,Quad PF,6+ Mos 06/21/2016, 07/24/2019, 07/22/2020, 07/06/2021, 07/11/2022   Influenza-Unspecified 08/15/2017, 07/01/2018   MMR 05/26/1974, 05/30/1999   PFIZER(Purple Top)SARS-COV-2 Vaccination 10/23/2019, 11/13/2019   Tdap 09/12/2018    Health Maintenance  Topic Date Due   OPHTHALMOLOGY EXAM  02/21/2018   MAMMOGRAM  07/12/2018   Zoster Vaccines- Shingrix (1 of 2) Never done   COVID-19 Vaccine (3 - Pfizer series) 01/08/2020   PAP SMEAR-Modifier  09/12/2021   HEMOGLOBIN A1C  07/28/2022   Diabetic kidney evaluation - GFR measurement  01/27/2023   Diabetic kidney evaluation -  Urine ACR  01/27/2023   FOOT EXAM  01/27/2023   COLONOSCOPY (Pts 45-7yr Insurance coverage will need to be confirmed)  05/26/2028   TETANUS/TDAP  09/12/2028   INFLUENZA VACCINE  Completed   Hepatitis C Screening  Completed   HIV Screening  Completed   HPV VACCINES  Aged Out    Discussed health benefits of physical activity, and encouraged her to engage in regular exercise appropriate for her age and condition.  Problem List Items Addressed This Visit       Cardiovascular and Mediastinum   Hypertension associated with diabetes ([place]    Elevated in office today Pt admits to not taking ramipril 5 mg consistently- maybe once every 2 weeks. She states when she took it daily it caused her to feel dizzy Advised we go to ramipril 2.5 mg DAILY.  rx sent cmp ordered F/u 4 mo if elevated at home to call office      Relevant Medications   metFORMIN (GLUCOPHAGE-XR) 500 MG 24 hr tablet   ramipril (ALTACE) 2.5 MG capsule   Other Relevant Orders   Comprehensive Metabolic Panel (CMET)     Endocrine   Type 2 diabetes mellitus with hyperglycemia, without long-term current use of insulin ([facility])    Last a1c 7.7%, uncontrolled ordered a1c Pt managing with 1000 mg metformin in Am and 500 mg in PM. She is making awesome lifestyle changes. On statin, on ace-I Foot exam and uacr utd Pt reports utd on optho will obtain records F/u 4 mo will contact with a1c when bw returns; discussed additional medications, pt may prefer rybelsus       Relevant Medications   metFORMIN (GLUCOPHAGE-XR) 500 MG 24 hr tablet   ramipril (ALTACE) 2.5 MG capsule   Other Relevant Orders   HgB A1c   Hyperlipidemia associated with type 2  diabetes mellitus ([place])    LDL goal < 70 Repeat fasting lipids today Managed with crestor 40 mg  The 10-year ASCVD risk score (Arnett DK, et al., 2019) is: 14.8%       Relevant Medications   metFORMIN (GLUCOPHAGE-XR) 500 MG 24 hr tablet   ramipril (ALTACE) 2.5 MG capsule   Other  Relevant Orders   Lipid Profile   Other Visit Diagnoses     Annual physical exam    -  Primary   Iron deficiency anemia, unspecified iron deficiency anemia type       Relevant Orders   Iron, TIBC and Ferritin Panel   CBC w/Diff/Platelet   Need for immunization against influenza       Relevant Orders   Flu Vaccine QUAD 67moIM (Fluarix, Fluzone & Alfiuria Quad PF) (Completed)      Reports mammo is UTD-- last one I see in care everywhere is 2020, she has an appt scheduled thru [facility]. Has appt with GYN next month to update pap  Return in about 4 months (around 11/11/2022) for hypertension, DMII.     I, LMikey Kirschner PA-C have reviewed all documentation for this visit. The documentation on  07/11/2022  for the exam, diagnosis, procedures, and orders are all accurate and complete.  LMikey Kirschner PA-C [facility] [place] [address]Office: [phone]: [phone]

## 2022-07-11 NOTE — Assessment & Plan Note (Addendum)
Last a1c 7.7%, uncontrolled ordered a1c Pt managing with 1000 mg metformin in Am and 500 mg in PM. She is making awesome lifestyle changes. On statin, on ace-I Foot exam and uacr utd Pt reports utd on optho will obtain records F/u 4 mo will contact with a1c when bw returns; discussed additional medications, pt may prefer rybelsus

## 2022-07-11 NOTE — Assessment & Plan Note (Addendum)
Elevated in office today Pt admits to not taking ramipril 5 mg consistently- maybe once every 2 weeks. She states when she took it daily it caused her to feel dizzy Advised we go to ramipril 2.5 mg DAILY.  rx sent cmp ordered F/u 4 mo if elevated at home to call office

## 2022-07-12 ENCOUNTER — Other Ambulatory Visit: Payer: Self-pay | Admitting: Family Medicine

## 2022-07-12 ENCOUNTER — Other Ambulatory Visit: Payer: Self-pay | Admitting: Physician Assistant

## 2022-07-12 DIAGNOSIS — E1165 Type 2 diabetes mellitus with hyperglycemia: Secondary | ICD-10-CM

## 2022-07-12 DIAGNOSIS — E119 Type 2 diabetes mellitus without complications: Secondary | ICD-10-CM

## 2022-07-12 LAB — COMPREHENSIVE METABOLIC PANEL
ALT: 12 IU/L (ref 0–32)
AST: 10 IU/L (ref 0–40)
Albumin/Globulin Ratio: 1.5 (ref 1.2–2.2)
Albumin: 4.6 g/dL (ref 3.8–4.9)
Alkaline Phosphatase: 75 IU/L (ref 44–121)
BUN/Creatinine Ratio: 10 (ref 9–23)
BUN: 7 mg/dL (ref 6–24)
Bilirubin Total: 0.2 mg/dL (ref 0.0–1.2)
CO2: 23 mmol/L (ref 20–29)
Calcium: 9.6 mg/dL (ref 8.7–10.2)
Chloride: 101 mmol/L (ref 96–106)
Creatinine, Ser: 0.72 mg/dL (ref 0.57–1.00)
Globulin, Total: 3 g/dL (ref 1.5–4.5)
Glucose: 200 mg/dL — ABNORMAL HIGH (ref 70–99)
Potassium: 4.7 mmol/L (ref 3.5–5.2)
Sodium: 140 mmol/L (ref 134–144)
Total Protein: 7.6 g/dL (ref 6.0–8.5)
eGFR: 100 mL/min/{1.73_m2} (ref >59)

## 2022-07-12 LAB — LIPID PANEL
Chol/HDL Ratio: 2.8 ratio (ref 0.0–4.4)
Cholesterol, Total: 199 mg/dL (ref 100–199)
HDL: 71 mg/dL (ref >39)
LDL Chol Calc (NIH): 120 mg/dL — ABNORMAL HIGH (ref 0–99)
Triglycerides: 45 mg/dL (ref 0–149)
VLDL Cholesterol Cal: 8 mg/dL (ref 5–40)

## 2022-07-12 LAB — CBC WITH DIFFERENTIAL/PLATELET
Basophils Absolute: 0 10*3/uL (ref 0.0–0.2)
Basos: 0 %
EOS (ABSOLUTE): 0.3 10*3/uL (ref 0.0–0.4)
Eos: 6 %
Hematocrit: 31.7 % — ABNORMAL LOW (ref 34.0–46.6)
Hemoglobin: 9.9 g/dL — ABNORMAL LOW (ref 11.1–15.9)
Immature Grans (Abs): 0 10*3/uL (ref 0.0–0.1)
Immature Granulocytes: 0 %
Lymphocytes Absolute: 1.8 10*3/uL (ref 0.7–3.1)
Lymphs: 38 %
MCH: 22.7 pg — ABNORMAL LOW (ref 26.6–33.0)
MCHC: 31.2 g/dL — ABNORMAL LOW (ref 31.5–35.7)
MCV: 73 fL — ABNORMAL LOW (ref 79–97)
Monocytes Absolute: 0.4 10*3/uL (ref 0.1–0.9)
Monocytes: 8 %
Neutrophils Absolute: 2.3 10*3/uL (ref 1.4–7.0)
Neutrophils: 48 %
Platelets: 236 10*3/uL (ref 150–450)
RBC: 4.36 x10E6/uL (ref 3.77–5.28)
RDW: 14 % (ref 11.7–15.4)
WBC: 4.8 10*3/uL (ref 3.4–10.8)

## 2022-07-12 LAB — IRON,TIBC AND FERRITIN PANEL
Ferritin: 5 ng/mL — ABNORMAL LOW (ref 15–150)
Iron Saturation: 6 % — CL (ref 15–55)
Iron: 25 ug/dL — ABNORMAL LOW (ref 27–159)
Total Iron Binding Capacity: 400 ug/dL (ref 250–450)
UIBC: 375 ug/dL (ref 131–425)

## 2022-07-12 LAB — HEMOGLOBIN A1C
Est. average glucose Bld gHb Est-mCnc: 200 mg/dL
Hgb A1c MFr Bld: 8.6 % — ABNORMAL HIGH (ref 4.8–5.6)

## 2022-07-12 MED ORDER — RYBELSUS 7 MG PO TABS: 7.0000 mg | ORAL_TABLET | Freq: Every day | ORAL | 1 refills | Status: DC

## 2022-07-12 MED ORDER — RYBELSUS 3 MG PO TABS: 3.0000 mg | ORAL_TABLET | Freq: Every day | ORAL | 0 refills | Status: DC

## 2022-07-16 ENCOUNTER — Telehealth: Payer: Self-pay

## 2022-07-16 NOTE — Telephone Encounter (Signed)
Copied from [place] [id number]. Topic: General - Other >> Jul 13, 2022  3:39 PM Everette C wrote: Reason for CRM: The patient has called to follow up on their request for Semaglutide (RYBELSUS) 3 MG TABS [[id number]]   Please contact the patient further when possible

## 2022-07-18 ENCOUNTER — Telehealth: Payer: Self-pay

## 2022-07-18 NOTE — Telephone Encounter (Signed)
Fax for PA is in to-do pile

## 2022-07-18 NOTE — Telephone Encounter (Signed)
Copied from [place] [id number]. Topic: General - Other >> Jul 16, 2022  1:16 PM Ja-Kwan M wrote: Reason for CRM: Pt reports that a prior authorization is needed for the RYBELSUS. Please send PA to fax# [phone]

## 2022-07-19 ENCOUNTER — Telehealth: Payer: Self-pay | Admitting: Physician Assistant

## 2022-07-19 ENCOUNTER — Telehealth: Payer: Self-pay

## 2022-07-19 NOTE — Telephone Encounter (Signed)
Patient calling back checking on the status of medication below. Patient would like a follow up call today.

## 2022-07-19 NOTE — Telephone Encounter (Signed)
Copied from [place] [id number]. Topic: General - Other >> Jul 18, 2022  5:26 PM Ja-Kwan M wrote: Reason for CRM: Pt stated she spoke with her insurance and was advised that the PA has not been received. Pt asked that the PA be sent asap so she can get her A1C down.

## 2022-07-19 NOTE — Telephone Encounter (Signed)
RE metFORMIN (GLUCOPHAGE-XR) 500 MG 24 hr tablet  Pt states that she is not picking up this script, she was out of metformin and took one of these her sister had and made her very sick, wants to go on the old one that [place] had prescribed. FU to advise. [phone]. [id number] [address], [place] [address]  Phone: [phone] Fax: [phone]  Hours: Not open 24 hours

## 2022-07-23 ENCOUNTER — Other Ambulatory Visit: Payer: Self-pay | Admitting: Physician Assistant

## 2022-07-23 DIAGNOSIS — E119 Type 2 diabetes mellitus without complications: Secondary | ICD-10-CM

## 2022-07-23 MED ORDER — METFORMIN HCL 1000 MG PO TABS: ORAL_TABLET | ORAL | 2 refills | Status: DC

## 2022-07-23 NOTE — Telephone Encounter (Signed)
Pt called again regarding Metformin.  Pt needs the "HCL" version of Metformin. Pt cannot take the XR version.

## 2022-07-23 NOTE — Telephone Encounter (Signed)
LVM making patient aware rx has been sent. Ok for [facility] to advise if patient returns call

## 2022-07-24 ENCOUNTER — Telehealth: Payer: Self-pay

## 2022-07-24 NOTE — Telephone Encounter (Signed)
[place] message also sent to patient.

## 2022-07-26 ENCOUNTER — Telehealth: Payer: Self-pay

## 2022-07-26 ENCOUNTER — Encounter: Payer: Commercial Managed Care - PPO | Admitting: Physician Assistant

## 2022-07-26 ENCOUNTER — Encounter: Payer: Commercial Managed Care - PPO | Admitting: Family Medicine

## 2022-07-26 NOTE — Telephone Encounter (Signed)
Copied from [place] [id number]. Topic: General - Call Back - No Documentation >> Jul 26, 2022  3:18 PM Cheyenne Acosta wrote: Reason for CRM: Pt stated she received a few missed calls from the office so she was just returning the call.

## 2022-08-19 ENCOUNTER — Other Ambulatory Visit: Payer: Self-pay | Admitting: Physician Assistant

## 2022-08-19 DIAGNOSIS — E1165 Type 2 diabetes mellitus with hyperglycemia: Secondary | ICD-10-CM

## 2022-08-20 ENCOUNTER — Telehealth: Payer: Self-pay | Admitting: Physician Assistant

## 2022-08-20 NOTE — Telephone Encounter (Signed)
Spoke with Tresa Endo at the pharmacy and made aware PA was approved on our end. She confirmed that it did go through so patient is now able to get rx filled

## 2022-08-20 NOTE — Telephone Encounter (Signed)
LVMTCB. CRM created. Ok for [facility] to advise 

## 2022-08-20 NOTE — Telephone Encounter (Signed)
Copied from CRM [id number]. Topic: Quick Communication - See Telephone Encounter >> Aug 20, 2022  9:00 AM Reeves Forth wrote: CRM for notification. See Telephone encounter for: 08/20/22.  Medication Refill - Medication: Semaglutide (RYBELSUS) 7 MG TABS    Has the patient contacted their pharmacy? No. (Agent: If no, request that the patient contact the pharmacy for the refill. If patient does not wish to contact the pharmacy document the reason why and proceed with request.) (Agent: If yes, when and what did the pharmacy advise?)  Preferred Pharmacy (with phone number or street name):  [id number] [place] [place] - [address]   Has the patient been seen for an appointment in the last year OR does the patient have an upcoming appointment? Yes.    Agent: Please be advised that RX refills may take up to 3 business days. We ask that you follow-up with your pharmacy.   Pt needing PA for RX.

## 2022-08-20 NOTE — Telephone Encounter (Signed)
LVM advising patient PA was approved and she can now have rx filled at pharmacy

## 2022-09-19 ENCOUNTER — Other Ambulatory Visit: Payer: Self-pay | Admitting: Physician Assistant

## 2022-09-19 DIAGNOSIS — E1165 Type 2 diabetes mellitus with hyperglycemia: Secondary | ICD-10-CM

## 2022-11-02 ENCOUNTER — Encounter: Payer: Self-pay | Admitting: Physician Assistant

## 2022-11-15 NOTE — Progress Notes (Signed)
I,Cheyenne Acosta,acting as a Education administrator for [employer], PA-C.,have documented all relevant documentation on the behalf of Cheyenne Kirschner, PA-C,as directed by  Cheyenne Kirschner, PA-C while in the presence of Cheyenne Kirschner, PA-C.     Established patient visit   Patient: Cheyenne Acosta   DOB: 02-Oct-1970   52 y.o. Female  MRN: [id number] Visit Date: 11/16/2022  Today's healthcare provider: [name], PA-C   Chief Complaint  Patient presents with   Hypertension   Diabetes   Subjective    HPI  Iron Deficiency Anemia -Pt is persistently anemic. Does not take an iron supplement. Has not scheduled appointments for iron infusion.   Hypertension associated with diabetes, follow-up  BP Readings from Last 3 Encounters:  11/16/22 125/73  07/11/22 (!) 151/85  06/19/22 137/76   Wt Readings from Last 3 Encounters:  11/16/22 176 lb 4.8 oz (80 kg)  07/11/22 182 lb (82.6 kg)  06/19/22 184 lb (83.5 kg)     She was last seen for hypertension 4 months ago.  BP at that visit was 151/85. Management since that visit includes ramipril 2.5 mg DAILY.   She reports excellent compliance with treatment. She is not having side effects.   Outside blood pressures are not being checked.   Pertinent labs Lab Results  Component Value Date   CHOL 199 07/11/2022   HDL 71 07/11/2022   LDLCALC 120 (H) 07/11/2022   TRIG 45 07/11/2022   CHOLHDL 2.8 07/11/2022   Lab Results  Component Value Date   NA 140 07/11/2022   K 4.7 07/11/2022   CREATININE 0.72 07/11/2022   EGFR 100 07/11/2022   GLUCOSE 200 (H) 07/11/2022   TSH 1.080 01/26/2022     The 10-year ASCVD risk score (Arnett DK, et al., 2019) is: 7.5%  Diabetes Mellitus Type II, Follow-up  Lab Results  Component Value Date   HGBA1C 8.6 (H) 07/11/2022   HGBA1C 7.7 (H) 01/26/2022   HGBA1C 7.2 (H) 07/06/2021   Last seen for diabetes 4 months ago.  Management since then includes start Rybelsus take 3 mg daily for 1 month and then  increase to 7 mg daily. She reports she is consistent with the 1500 mg of Metformin daily and the Rybelsus 7 mg. She reports excellent compliance with treatment. She is not having side effects.    Home blood sugar records:  not being checked  Episodes of hypoglycemia? No    Current insulin regiment: none Most Recent Eye Exam: June 2023. [facility]   Pertinent Labs: Lab Results  Component Value Date   CHOL 199 07/11/2022   HDL 71 07/11/2022   LDLCALC 120 (H) 07/11/2022   TRIG 45 07/11/2022   CHOLHDL 2.8 07/11/2022   Lab Results  Component Value Date   NA 140 07/11/2022   K 4.7 07/11/2022   CREATININE 0.72 07/11/2022   EGFR 100 07/11/2022   LABMICR <3.0 01/26/2022   MICRALBCREAT <28 01/26/2022     ---------------------------------------------------------------------------------------------------   Medications: Outpatient Medications Prior to Visit  Medication Sig   aspirin 81 MG chewable tablet Chew by mouth daily.   diazepam (VALIUM) 5 MG tablet Take 1 tablet (5 mg total) by mouth every 12 (twelve) hours as needed for muscle spasms.   metFORMIN (GLUCOPHAGE) 1000 MG tablet Take 1 tablet by mouth int he morning and one half tablet in the evening   Omega-3 Fatty Acids (FISH OIL) 1200 MG CAPS Take by mouth.   ramipril (ALTACE) 2.5 MG capsule Take 1 capsule (2.5  mg total) by mouth daily.   Semaglutide (RYBELSUS) 7 MG TABS Take 7 mg by mouth daily. Start this prescription AFTER 30 days of the 3 mg dose   rosuvastatin (CRESTOR) 40 MG tablet Take 1 tablet (40 mg total) by mouth daily.   [DISCONTINUED] prasugrel (EFFIENT) 10 MG TABS tablet Take 10 mg by mouth daily.   [DISCONTINUED] Semaglutide (RYBELSUS) 3 MG TABS Take 3 mg by mouth daily. For 30 days. Start with this dose.   No facility-administered medications prior to visit.    Review of Systems  Constitutional:  Positive for appetite change. Negative for fatigue and unexpected weight change.  Eyes:  Negative for visual  disturbance.  Respiratory:  Negative for chest tightness and shortness of breath.   Cardiovascular:  Negative for chest pain and leg swelling.  Gastrointestinal:  Negative for abdominal pain, nausea and vomiting.     Objective    BP 125/73 (BP Location: Left Arm, Patient Position: Sitting, Cuff Size: Large)   Pulse 95   Temp 97.8 F (36.6 C) (Temporal)   Resp 16   Ht 5' 1"$  (1.549 m)   Wt 176 lb 4.8 oz (80 kg)   BMI 33.31 kg/m    Physical Exam Constitutional:      General: She is awake.     Appearance: She is well-developed.  HENT:     Head: Normocephalic.  Eyes:     Conjunctiva/sclera: Conjunctivae normal.  Cardiovascular:     Rate and Rhythm: Normal rate and regular rhythm.     Heart sounds: Normal heart sounds.  Pulmonary:     Effort: Pulmonary effort is normal.     Breath sounds: Normal breath sounds.  Skin:    General: Skin is warm.  Neurological:     Mental Status: She is alert and oriented to person, place, and time.  Psychiatric:        Attention and Perception: Attention normal.        Mood and Affect: Mood normal.        Speech: Speech normal.        Behavior: Behavior is cooperative.     No results found for any visits on 11/16/22.  Assessment & Plan     Problem List Items Addressed This Visit       Endocrine   Type 2 diabetes mellitus with hyperglycemia, without long-term current use of insulin ([place]) - Primary    Previously 7.7% at our office-- labs from GYN 8.6% 10/23. Pt declines poc today, labs ordered Managed with 1500 mg metformin daily, rybelsus 7 mg . Advised our goal is A1c < 7%. If she is not there, would need to start another agent. Pt declines additional medications at this time. Pt declines to stake statin On ace I  Foot exam and uacr utd Pt reports optho within the last year we will get records. F/u 3-4 mo pending a1c       Relevant Orders   HgB A1c   TSH   Hyperlipidemia associated with type 2 diabetes mellitus ([place])    LDL  goal < 70, pt declines to take statin . She believes the meformin is raising her cholesterol and does not agree with 'the government's' recommendation of lower cholesterol. Educated on risks of heart disease, heart attack, stroke with uncontrolled diabetes and atherosclerotic disease. The 10-year ASCVD risk score (Arnett DK, et al., 2019) is: 7.5%       Relevant Orders   Lipid panel     Other  Iron deficiency anemia    Pt has declined to take oral agents or schedule an iron infusion despite multiple referrals from [place] and her GYN  Will continue to monitor       Relevant Orders   CBC w/Diff/Platelet   Iron, TIBC and Ferritin Panel     Return in about 4 months (around 03/17/2023) for DMII.      I, Cheyenne Kirschner, PA-C have reviewed all documentation for this visit. The documentation on  11/16/22  for the exam, diagnosis, procedures, and orders are all accurate and complete.  Cheyenne Kirschner, PA-C [facility] [address] [place], [place], [address] Office: [phone] Fax: [place]

## 2022-11-16 ENCOUNTER — Ambulatory Visit: Payer: BC Managed Care – PPO | Admitting: Physician Assistant

## 2022-11-16 ENCOUNTER — Encounter: Payer: Self-pay | Admitting: Physician Assistant

## 2022-11-16 VITALS — BP 125/73 | HR 95 | Temp 97.8°F | Resp 16 | Ht 61.0 in | Wt 176.3 lb

## 2022-11-16 DIAGNOSIS — E1165 Type 2 diabetes mellitus with hyperglycemia: Secondary | ICD-10-CM

## 2022-11-16 DIAGNOSIS — E1169 Type 2 diabetes mellitus with other specified complication: Secondary | ICD-10-CM

## 2022-11-16 DIAGNOSIS — E785 Hyperlipidemia, unspecified: Secondary | ICD-10-CM

## 2022-11-16 DIAGNOSIS — D509 Iron deficiency anemia, unspecified: Secondary | ICD-10-CM | POA: Diagnosis not present

## 2022-11-16 NOTE — Assessment & Plan Note (Signed)
LDL goal < 70, pt declines to take statin . She believes the meformin is raising her cholesterol and does not agree with 'the government's' recommendation of lower cholesterol. Educated on risks of heart disease, heart attack, stroke with uncontrolled diabetes and atherosclerotic disease. The 10-year ASCVD risk score (Arnett DK, et al., 2019) is: 7.5%

## 2022-11-16 NOTE — Assessment & Plan Note (Addendum)
Previously 7.7% at our office-- labs from GYN 8.6% 10/23. Pt declines poc today, labs ordered Managed with 1500 mg metformin daily, rybelsus 7 mg . Advised our goal is A1c < 7%. If she is not there, would need to start another agent. Pt declines additional medications at this time. Pt declines to stake statin On ace I  Foot exam and uacr utd Pt reports optho within the last year we will get records. F/u 3-4 mo pending a1c

## 2022-11-16 NOTE — Assessment & Plan Note (Signed)
Pt has declined to take oral agents or schedule an iron infusion despite multiple referrals from [place] and her GYN  Will continue to monitor

## 2022-11-17 LAB — TSH: TSH: 0.698 u[IU]/mL (ref 0.450–4.500)

## 2022-11-17 LAB — CBC WITH DIFFERENTIAL/PLATELET
Basophils Absolute: 0 10*3/uL (ref 0.0–0.2)
Basos: 1 %
EOS (ABSOLUTE): 0.2 10*3/uL (ref 0.0–0.4)
Eos: 4 %
Hematocrit: 31.4 % — ABNORMAL LOW (ref 34.0–46.6)
Hemoglobin: 9.9 g/dL — ABNORMAL LOW (ref 11.1–15.9)
Immature Grans (Abs): 0 10*3/uL (ref 0.0–0.1)
Immature Granulocytes: 0 %
Lymphocytes Absolute: 1.8 10*3/uL (ref 0.7–3.1)
Lymphs: 41 %
MCH: 23.7 pg — ABNORMAL LOW (ref 26.6–33.0)
MCHC: 31.5 g/dL (ref 31.5–35.7)
MCV: 75 fL — ABNORMAL LOW (ref 79–97)
Monocytes Absolute: 0.4 10*3/uL (ref 0.1–0.9)
Monocytes: 8 %
Neutrophils Absolute: 2 10*3/uL (ref 1.4–7.0)
Neutrophils: 46 %
Platelets: 263 10*3/uL (ref 150–450)
RBC: 4.17 x10E6/uL (ref 3.77–5.28)
RDW: 15.5 % — ABNORMAL HIGH (ref 11.7–15.4)
WBC: 4.4 10*3/uL (ref 3.4–10.8)

## 2022-11-17 LAB — LIPID PANEL
Chol/HDL Ratio: 3.5 ratio (ref 0.0–4.4)
Cholesterol, Total: 235 mg/dL — ABNORMAL HIGH (ref 100–199)
HDL: 68 mg/dL (ref >39)
LDL Chol Calc (NIH): 158 mg/dL — ABNORMAL HIGH (ref 0–99)
Triglycerides: 56 mg/dL (ref 0–149)
VLDL Cholesterol Cal: 9 mg/dL (ref 5–40)

## 2022-11-17 LAB — HEMOGLOBIN A1C
Est. average glucose Bld gHb Est-mCnc: 160 mg/dL
Hgb A1c MFr Bld: 7.2 % — ABNORMAL HIGH (ref 4.8–5.6)

## 2022-11-17 LAB — IRON,TIBC AND FERRITIN PANEL
Ferritin: 5 ng/mL — ABNORMAL LOW (ref 15–150)
Iron Saturation: 7 % — CL (ref 15–55)
Iron: 25 ug/dL — ABNORMAL LOW (ref 27–159)
Total Iron Binding Capacity: 347 ug/dL (ref 250–450)
UIBC: 322 ug/dL (ref 131–425)

## 2022-11-19 ENCOUNTER — Other Ambulatory Visit: Payer: Self-pay | Admitting: Physician Assistant

## 2022-11-19 MED ORDER — ROSUVASTATIN CALCIUM 40 MG PO TABS: 40.0000 mg | ORAL_TABLET | Freq: Every day | ORAL | 3 refills | Status: DC

## 2023-01-18 ENCOUNTER — Other Ambulatory Visit: Payer: Self-pay | Admitting: Physician Assistant

## 2023-01-18 DIAGNOSIS — E1165 Type 2 diabetes mellitus with hyperglycemia: Secondary | ICD-10-CM

## 2023-01-18 DIAGNOSIS — E119 Type 2 diabetes mellitus without complications: Secondary | ICD-10-CM

## 2023-01-18 DIAGNOSIS — E1159 Type 2 diabetes mellitus with other circulatory complications: Secondary | ICD-10-CM

## 2023-01-22 ENCOUNTER — Telehealth: Payer: Self-pay | Admitting: Physician Assistant

## 2023-01-22 NOTE — Telephone Encounter (Signed)
Please contact pt for an appt earlier than 6/24 She is under the impression her A1c is a 4.8?? It was not last time over 7%.  She should NOT stop her diabetic meds.

## 2023-01-22 NOTE — Telephone Encounter (Signed)
Patient reports she is not stopping any medications she is taking them

## 2023-02-20 ENCOUNTER — Other Ambulatory Visit: Payer: Self-pay | Admitting: Physician Assistant

## 2023-02-20 DIAGNOSIS — E1165 Type 2 diabetes mellitus with hyperglycemia: Secondary | ICD-10-CM

## 2023-02-28 ENCOUNTER — Telehealth: Payer: Self-pay

## 2023-02-28 NOTE — Telephone Encounter (Signed)
Copied from CRM [id number]. Topic: Referral - Question >> Feb 28, 2023 10:29 AM [name] wrote: Reason for CRM: Pt is calling in because she was referred to [facility] and she wanted to be referred to [facility] for a hematologist. Pt says not "[facility]", but "[facility]"

## 2023-03-01 NOTE — Telephone Encounter (Signed)
MyChart message sent and LVM. Ok for [facility] to advise if patient returns call

## 2023-03-11 ENCOUNTER — Other Ambulatory Visit: Payer: Self-pay | Admitting: Physician Assistant

## 2023-03-11 DIAGNOSIS — G8929 Other chronic pain: Secondary | ICD-10-CM

## 2023-03-11 NOTE — Telephone Encounter (Signed)
Medication Refill - Medication: diazepam (VALIUM) 5 MG tablet [[id number]]   Has the patient contacted their pharmacy? No. ( Preferred Pharmacy (with phone number or street name):   [id number] - [place], [place] - [address]  [address] [place] [address]  Phone: [phone] Fax: [phone]  Hours: Not open 24 hours    Has the patient been seen for an appointment in the last year OR does the patient have an upcoming appointment? Yes.    Agent: Please be advised that RX refills may take up to 3 business days. We ask that you follow-up with your pharmacy.

## 2023-03-12 NOTE — Telephone Encounter (Signed)
Requested medication (s) are due for refill today: yes  Requested medication (s) are on the active medication list: yes  Last refill:  01/26/22  Future visit scheduled: yes  Notes to clinic:  Unable to refill per protocol, cannot delegate.      Requested Prescriptions  Pending Prescriptions Disp Refills   diazepam (VALIUM) 5 MG tablet 30 tablet 0    Sig: Take 1 tablet (5 mg total) by mouth every 12 (twelve) hours as needed for muscle spasms.     Not Delegated - Psychiatry: Anxiolytics/Hypnotics 2 Failed - 03/11/2023  2:19 PM      Failed - This refill cannot be delegated      Failed - Urine Drug Screen completed in last 360 days      Passed - Patient is not pregnant      Passed - Valid encounter within last 6 months    Recent Outpatient Visits           3 months ago Type 2 diabetes mellitus with hyperglycemia, without long-term current use of insulin [facility])   [facility] Alfredia Ferguson, PA-C   8 months ago Annual physical exam   [facility] Alfredia Ferguson, PA-C   8 months ago Red-colored urine   [facility] [place], [place], PA-C   1 year ago Type 2 diabetes mellitus with hyperglycemia, without long-term current use of insulin [facility])   [facility] Merita Norton T, FNP   1 year ago Need for influenza vaccination   [facility] Jacky Kindle, FNP       Future Appointments             In 1 week [place] Edwards, [facility], [facility]

## 2023-03-13 MED ORDER — DIAZEPAM 5 MG PO TABS
5.0000 mg | ORAL_TABLET | Freq: Two times a day (BID) | ORAL | 0 refills | Status: DC | PRN
Start: 2023-03-13 — End: 2024-03-06

## 2023-03-22 ENCOUNTER — Encounter: Payer: Self-pay | Admitting: Physician Assistant

## 2023-03-22 ENCOUNTER — Ambulatory Visit: Payer: BC Managed Care – PPO | Admitting: Physician Assistant

## 2023-03-22 VITALS — BP 131/75 | HR 84 | Temp 98.3°F | Resp 14 | Ht 61.0 in | Wt 172.6 lb

## 2023-03-22 DIAGNOSIS — E1165 Type 2 diabetes mellitus with hyperglycemia: Secondary | ICD-10-CM

## 2023-03-22 DIAGNOSIS — Z1211 Encounter for screening for malignant neoplasm of colon: Secondary | ICD-10-CM

## 2023-03-22 LAB — POCT GLYCOSYLATED HEMOGLOBIN (HGB A1C): Hemoglobin A1C: 7.5 % — AB (ref 4.0–5.6)

## 2023-03-22 MED ORDER — RYBELSUS 14 MG PO TABS
14.0000 mg | ORAL_TABLET | Freq: Every day | ORAL | 1 refills | Status: DC
Start: 2023-03-22 — End: 2023-05-09

## 2023-03-22 NOTE — Assessment & Plan Note (Signed)
Last A1c 7.2% today 7.5%. pt reports eating a plant based diet. Denies changes in diet, exercise but admits to being stressed at work Manages with 1000 mg metformin daily, rybelsus 7 mg . Advised to increase rybelsus to 14 mg daily in AM. Goal is A1c < 7%. Given discussion on lifestyle/job today, pt unable to void for uacr-- needs foot exam uacr next visit.  Reports optho is utd we do not have records. On ace, pt declines statin.  F/u 4 mo

## 2023-03-22 NOTE — Progress Notes (Signed)
I,Vanessa  Vital,acting as a Neurosurgeon for [employer], PA-C.,have documented all relevant documentation on the behalf of Alfredia Ferguson, PA-C,as directed by  Alfredia Ferguson, PA-C while in the presence of Alfredia Ferguson, PA-C.   Established patient visit   Patient: Cheyenne Acosta   DOB: 23-Jul-1971   52 y.o. Female  MRN: [id number] Visit Date: 03/22/2023  Today's healthcare provider: Alfredia Ferguson, PA-C   Cc. Dm f/u  Subjective    HPI  Diabetes Mellitus Type II, Follow-up  Lab Results  Component Value Date   HGBA1C 7.5 (A) 03/22/2023   HGBA1C 7.2 (H) 11/16/2022   HGBA1C 8.6 (H) 07/11/2022   Wt Readings from Last 3 Encounters:  03/22/23 172 lb 9.6 oz (78.3 kg)  11/16/22 176 lb 4.8 oz (80 kg)  07/11/22 182 lb (82.6 kg)   Last seen for diabetes 4 months ago.  Management since then includes no changes. She reports excellent compliance with treatment. She is not having side effects.  Symptoms: No fatigue No foot ulcerations  No appetite changes No nausea  No paresthesia of the feet  No polydipsia  No polyuria No visual disturbances   No vomiting     Home blood sugar records:  does not check  Episodes of hypoglycemia? No    Pertinent Labs: Lab Results  Component Value Date   CHOL 235 (H) 11/16/2022   HDL 68 11/16/2022   LDLCALC 158 (H) 11/16/2022   TRIG 56 11/16/2022   CHOLHDL 3.5 11/16/2022   Lab Results  Component Value Date   NA 140 07/11/2022   K 4.7 07/11/2022   CREATININE 0.72 07/11/2022   EGFR 100 07/11/2022   LABMICR <3.0 01/26/2022   MICRALBCREAT <28 01/26/2022     ---------------------------------------------------------------------------------------------------   Medications: Outpatient Medications Prior to Visit  Medication Sig   aspirin 81 MG chewable tablet Chew by mouth daily.   diazepam (VALIUM) 5 MG tablet Take 1 tablet (5 mg total) by mouth every 12 (twelve) hours as needed for muscle spasms.   metFORMIN (GLUCOPHAGE) 1000 MG  tablet TAKE 1 TABLET BY MOUTH IN THE MORNING AND ONE HALF TABLET IN THE EVENING   Omega-3 Fatty Acids (FISH OIL) 1200 MG CAPS Take by mouth.   ramipril (ALTACE) 2.5 MG capsule TAKE 1 CAPSULE BY MOUTH EVERY DAY   rosuvastatin (CRESTOR) 40 MG tablet Take 1 tablet (40 mg total) by mouth daily.   [DISCONTINUED] Semaglutide (RYBELSUS) 7 MG TABS Take 1 tablet (7 mg total) by mouth daily before breakfast.   No facility-administered medications prior to visit.    Review of Systems  All other systems reviewed and are negative.     Objective    BP 131/75 (BP Location: Left Arm, Patient Position: Sitting, Cuff Size: Normal)   Pulse 84   Temp 98.3 F (36.8 C) (Oral)   Resp 14   Ht 5\' 1"  (1.549 m)   Wt 172 lb 9.6 oz (78.3 kg)   LMP 07/24/2019   SpO2 100%   BMI 32.61 kg/m   Physical Exam Vitals reviewed.  Constitutional:      Appearance: She is not ill-appearing.  HENT:     Head: Normocephalic.  Eyes:     Conjunctiva/sclera: Conjunctivae normal.  Cardiovascular:     Rate and Rhythm: Normal rate.  Pulmonary:     Effort: Pulmonary effort is normal. No respiratory distress.  Neurological:     General: No focal deficit present.     Mental Status: She is alert and oriented  to person, place, and time.  Psychiatric:        Mood and Affect: Mood normal.        Behavior: Behavior normal.      Results for orders placed or performed in visit on 03/22/23  POCT HgB A1C  Result Value Ref Range   Hemoglobin A1C 7.5 (A) 4.0 - 5.6 %   HbA1c POC (<> result, manual entry)     HbA1c, POC (prediabetic range)     HbA1c, POC (controlled diabetic range)      Assessment & Plan     Problem List Items Addressed This Visit       Endocrine   Type 2 diabetes mellitus with hyperglycemia, without long-term current use of insulin (HCC) - Primary    Last A1c 7.2% today 7.5%. pt reports eating a plant based diet. Denies changes in diet, exercise but admits to being stressed at work Manages with 1000  mg metformin daily, rybelsus 7 mg . Advised to increase rybelsus to 14 mg daily in AM. Goal is A1c < 7%. Given discussion on lifestyle/job today, pt unable to void for uacr-- needs foot exam uacr next visit.  Reports optho is utd we do not have records. On ace, pt declines statin.  F/u 4 mo      Relevant Medications   Semaglutide (RYBELSUS) 14 MG TABS   Other Relevant Orders   POCT HgB A1C (Completed)   Other Visit Diagnoses     Colon cancer screening       Relevant Orders   Cologuard       Pt reports her mammo/paps are UTD but I do not have records. Last mammo I see was 2020. She reports she has her annual appt in October.   Return in about 4 months (around 07/22/2023) for DMII.     I, Alfredia Ferguson, PA-C have reviewed all documentation for this visit. The documentation on  03/22/23   for the exam, diagnosis, procedures, and orders are all accurate and complete.  Alfredia Ferguson, PA-C [facility] [address] [place], [place], [address] Office: [phone] Fax: [phone]   [facility]

## 2023-03-25 ENCOUNTER — Other Ambulatory Visit: Payer: Self-pay | Admitting: Internal Medicine

## 2023-03-25 DIAGNOSIS — I25118 Atherosclerotic heart disease of native coronary artery with other forms of angina pectoris: Secondary | ICD-10-CM

## 2023-03-25 DIAGNOSIS — R9431 Abnormal electrocardiogram [ECG] [EKG]: Secondary | ICD-10-CM

## 2023-03-25 DIAGNOSIS — I10 Essential (primary) hypertension: Secondary | ICD-10-CM

## 2023-03-25 DIAGNOSIS — R002 Palpitations: Secondary | ICD-10-CM

## 2023-03-25 DIAGNOSIS — E785 Hyperlipidemia, unspecified: Secondary | ICD-10-CM

## 2023-03-26 ENCOUNTER — Ambulatory Visit: Payer: BC Managed Care – PPO | Attending: Internal Medicine

## 2023-03-27 ENCOUNTER — Telehealth: Payer: Self-pay

## 2023-03-27 NOTE — Transitions of Care (Post Inpatient/ED Visit) (Unsigned)
   03/27/2023  Name: Cheyenne Acosta MRN: [id number] DOB: 12/04/1970  Today's TOC FU Call Status: Today's TOC FU Call Status:: Unsuccessul Call (1st Attempt) Unsuccessful Call (1st Attempt) Date: 03/27/23  Attempted to reach the patient regarding the most recent Inpatient/ED visit.  Follow Up Plan: Additional outreach attempts will be made to reach the patient to complete the Transitions of Care (Post Inpatient/ED visit) call.   Signature Karena Addison, LPN [facility] Nurse Health Advisor Direct Dial [phone]

## 2023-03-28 NOTE — Transitions of Care (Post Inpatient/ED Visit) (Signed)
   03/28/2023  Name: Cheyenne Acosta MRN: [id number] DOB: 05-24-1971  Today's TOC FU Call Status: Today's TOC FU Call Status:: Successful TOC FU Call Competed Unsuccessful Call (1st Attempt) Date: 03/27/23 [facility] FU Call Complete Date: 03/28/23  Transition Care Management Follow-up Telephone Call Date of Discharge: 03/26/23 Discharge Facility: Other ([facility]) Name of Other ([facility]) Discharge Facility: [facility] Type of Discharge: Inpatient Admission Primary Inpatient Discharge Diagnosis:: other chest pain How have you been since you were released from the hospital?: Better Any questions or concerns?: No  Items Reviewed: Did you receive and understand the discharge instructions provided?: Yes Medications obtained,verified, and reconciled?: Yes (Medications Reviewed) Any new allergies since your discharge?: No Dietary orders reviewed?: Yes Do you have support at home?: Yes People in Home: spouse  Medications Reviewed Today: Medications Reviewed Today     Reviewed by Karena Addison, LPN (Licensed Practical Nurse) on 03/28/23 at 1410  Med List Status: <None>   Medication Order Taking? Sig Documenting Provider Last Dose Status Informant  aspirin 81 MG chewable tablet [id number] No Chew by mouth daily. [provider] Taking Active   diazepam (VALIUM) 5 MG tablet [id number] No Take 1 tablet (5 mg total) by mouth every 12 (twelve) hours as needed for muscle spasms. Alfredia Ferguson, PA-C Taking Active   metFORMIN (GLUCOPHAGE) 1000 MG tablet [id number] No TAKE 1 TABLET BY MOUTH IN THE MORNING AND ONE HALF TABLET IN THE EVENING Alfredia Ferguson, PA-C Taking Active   Omega-3 Fatty Acids (FISH OIL) 1200 MG CAPS [id number] No Take by mouth. [provider] Taking Active   ramipril (ALTACE) 2.5 MG capsule [id number] No TAKE 1 CAPSULE BY MOUTH EVERY DAY Drubel, Lillia Abed, PA-C Taking Active   rosuvastatin (CRESTOR) 40 MG tablet [id number] No Take 1 tablet (40 mg total) by mouth  daily. Alfredia Ferguson, PA-C Taking Active   Semaglutide (RYBELSUS) 14 MG TABS [id number]  Take 1 tablet (14 mg total) by mouth daily. Before breakfast Alfredia Ferguson, PA-C  Active             Home Care and Equipment/Supplies: Were Home Health Services Ordered?: NA Any new equipment or medical supplies ordered?: NA  Functional Questionnaire: Do you need assistance with bathing/showering or dressing?: No Do you need assistance with meal preparation?: No Do you need assistance with eating?: No Do you have difficulty maintaining continence: No Do you need assistance with getting out of bed/getting out of a chair/moving?: No Do you have difficulty managing or taking your medications?: No  Follow up appointments reviewed: PCP Follow-up appointment confirmed?: Yes Date of PCP follow-up appointment?: 03/29/23 Follow-up Provider: [facility] Follow-up appointment confirmed?: NA Do you need transportation to your follow-up appointment?: No Do you understand care options if your condition(s) worsen?: Yes-patient verbalized understanding    SIGNATURE Karena Addison, LPN [facility] Nurse Health Advisor Direct Dial [phone]

## 2023-03-29 ENCOUNTER — Inpatient Hospital Stay: Payer: BC Managed Care – PPO | Admitting: Physician Assistant

## 2023-04-05 ENCOUNTER — Ambulatory Visit: Payer: BC Managed Care – PPO | Admitting: Family Medicine

## 2023-04-15 LAB — COLOGUARD: COLOGUARD: NEGATIVE

## 2023-05-09 ENCOUNTER — Telehealth: Payer: BC Managed Care – PPO | Admitting: Physician Assistant

## 2023-05-09 DIAGNOSIS — Z789 Other specified health status: Secondary | ICD-10-CM | POA: Diagnosis not present

## 2023-05-09 MED ORDER — RYBELSUS 7 MG PO TABS: 7.0000 mg | ORAL_TABLET | Freq: Every day | ORAL | 0 refills | Status: DC

## 2023-05-09 NOTE — Patient Instructions (Signed)
  Harmon Dun, thank you for joining [name], PA-C for today's virtual visit.  While this provider is not your primary care provider (PCP), if your PCP is located in our provider database this encounter information will be shared with them immediately following your visit.   A [facility] MyChart account gives you access to today's visit and all your visits, tests, and labs performed at [facility] " click here if you don't have a [facility] MyChart account or go to [email or web address]  Consent: (Patient) Cheyenne Acosta provided verbal consent for this virtual visit at the beginning of the encounter.  Current Medications:  Current Outpatient Medications:    aspirin 81 MG chewable tablet, Chew by mouth daily., Disp: , Rfl:    diazepam (VALIUM) 5 MG tablet, Take 1 tablet (5 mg total) by mouth every 12 (twelve) hours as needed for muscle spasms., Disp: 15 tablet, Rfl: 0   metFORMIN (GLUCOPHAGE) 1000 MG tablet, TAKE 1 TABLET BY MOUTH IN THE MORNING AND ONE HALF TABLET IN THE EVENING, Disp: 45 tablet, Rfl: 5   Omega-3 Fatty Acids (FISH OIL) 1200 MG CAPS, Take by mouth., Disp: , Rfl:    ramipril (ALTACE) 2.5 MG capsule, TAKE 1 CAPSULE BY MOUTH EVERY DAY, Disp: 30 capsule, Rfl: 5   rosuvastatin (CRESTOR) 40 MG tablet, Take 1 tablet (40 mg total) by mouth daily., Disp: 90 tablet, Rfl: 3   Semaglutide (RYBELSUS) 14 MG TABS, Take 1 tablet (14 mg total) by mouth daily. Before breakfast, Disp: 90 tablet, Rfl: 1   Medications ordered in this encounter:  No orders of the defined types were placed in this encounter.    *If you need refills on other medications prior to your next appointment, please contact your pharmacy*  Follow-Up: Call back or seek an in-person evaluation if the symptoms worsen or if the condition fails to improve as anticipated.  [facility] [phone]  Other Instructions I have sent in a one-time script for the 7 mg dose of  Rybelsus. Let me know if you have any issue picking up. Make sure to contact your PCP to go ahead and schedule a follow-up. As discussed, I am sending her a copy of this note as well so she is aware of what we discussed today.    If you have been instructed to have an in-person evaluation today at a local Urgent Care facility, please use the link below. It will take you to a list of all of our available [facility], including address, phone number and hours of operation. Please do not delay care.  [facility]  If you or a family member do not have a primary care provider, use the link below to schedule a visit and establish care. When you choose a [facility] primary care physician or advanced practice provider, you gain a long-term partner in health. Find a Primary Care Provider  Learn more about [facility] in-office and virtual care options: [place] - Get Care Now

## 2023-05-09 NOTE — Progress Notes (Signed)
Virtual Visit Consent   Cheyenne Acosta, you are scheduled for a virtual visit with a [facility] provider today. Just as with appointments in the office, your consent must be obtained to participate. Your consent will be active for this visit and any virtual visit you may have with one of our providers in the next 365 days. If you have a MyChart account, a copy of this consent can be sent to you electronically.  As this is a virtual visit, video technology does not allow for your provider to perform a traditional examination. This may limit your provider's ability to fully assess your condition. If your provider identifies any concerns that need to be evaluated in person or the need to arrange testing (such as labs, EKG, etc.), we will make arrangements to do so. Although advances in technology are sophisticated, we cannot ensure that it will always work on either your end or our end. If the connection with a video visit is poor, the visit may have to be switched to a telephone visit. With either a video or telephone visit, we are not always able to ensure that we have a secure connection.  By engaging in this virtual visit, you consent to the provision of healthcare and authorize for your insurance to be billed (if applicable) for the services provided during this visit. Depending on your insurance coverage, you may receive a charge related to this service.  I need to obtain your verbal consent now. Are you willing to proceed with your visit today? [name] has provided verbal consent on 05/09/2023 for a virtual visit (video or telephone). Cheyenne Acosta, [place]  Date: 05/09/2023 8:01 AM  Virtual Visit via Video Note   I, Cheyenne Acosta, connected with  Cheyenne Acosta  ([id number], 1971/01/29) on 05/09/23 at  8:00 AM EDT by a video-enabled telemedicine application and verified that I am speaking with the correct person using two identifiers.  Location: Patient: Virtual Visit  Location Patient: Other: Work Provider: Pharmacist, community: Home Office   I discussed the limitations of evaluation and management by telemedicine and the availability of in person appointments. The patient expressed understanding and agreed to proceed.    History of Present Illness: Cheyenne Acosta is a 52 y.o. who identifies as a female who was assigned female at birth, and is being seen today for difficulty tolerating her increased dose of Rybelsus. Notes starting on this dose a month ago and it causing substantial issue with appetite to the point to where she cannot eat much and if she does, she gets a very upset stomach or bouts of substantial constipation. Is trying to keep hydrated. Notes she tolerated lower dose (7mg ) well without any issue but since A1C was still a bit elevated at 7.5, PCP wanted to attempt higher dose for tighter glycemic control. States she has not been able to get in with her PCP in a timely fashion so reached out to [place].  Lab Results  Component Value Date   HGBA1C 7.5 (A) 03/22/2023   .  HPI: HPI  Problems:  Patient Active Problem List   Diagnosis Date Noted   Iron deficiency anemia 11/16/2022   Type 2 diabetes mellitus with hyperglycemia, without long-term current use of insulin (HCC) 01/26/2022   Hypertension associated with diabetes (HCC) 01/26/2022   Hyperlipidemia associated with type 2 diabetes mellitus (HCC) 01/26/2022   Chronic fatigue 01/26/2022   Encounter for hepatitis C screening test for low risk patient 01/26/2022   Drug  screening declined 12/01/2021   Chest pain of uncertain etiology 08/04/2020   Palpitations 08/04/2020   Abnormal EKG 08/04/2020   Chronic neck pain 11/22/2016   Absence of menstruation 09/14/2015   Advanced maternal age in multigravida 08/12/2014   H/O previous obstetrical problem 08/12/2014   HLD (hyperlipidemia) 07/15/2006    Allergies:  Allergies  Allergen Reactions   Latex    Shellfish Allergy     Aspirin     Abdominal discomfort   Sulfa Antibiotics Rash   Medications:  Current Outpatient Medications:    Semaglutide (RYBELSUS) 7 MG TABS, Take 1 tablet (7 mg total) by mouth daily., Disp: 30 tablet, Rfl: 0   aspirin 81 MG chewable tablet, Chew by mouth daily., Disp: , Rfl:    diazepam (VALIUM) 5 MG tablet, Take 1 tablet (5 mg total) by mouth every 12 (twelve) hours as needed for muscle spasms., Disp: 15 tablet, Rfl: 0   metFORMIN (GLUCOPHAGE) 1000 MG tablet, TAKE 1 TABLET BY MOUTH IN THE MORNING AND ONE HALF TABLET IN THE EVENING, Disp: 45 tablet, Rfl: 5   Omega-3 Fatty Acids (FISH OIL) 1200 MG CAPS, Take by mouth., Disp: , Rfl:    ramipril (ALTACE) 2.5 MG capsule, TAKE 1 CAPSULE BY MOUTH EVERY DAY, Disp: 30 capsule, Rfl: 5   rosuvastatin (CRESTOR) 40 MG tablet, Take 1 tablet (40 mg total) by mouth daily., Disp: 90 tablet, Rfl: 3  Observations/Objective: Patient is well-developed, well-nourished in no acute distress.  Resting comfortably at work. Head is normocephalic, atraumatic.  No labored breathing. Speech is clear and coherent with logical content.  Patient is alert and oriented at baseline.   Assessment and Plan: 1. Medication intolerance  Will reduce her back down to the 7 mg Rybelsus until she can follow-up with her PCP. Discussed they may want to leave her at 7 mg and have her work more on diet and exercise, attempt to add on a 3 mg tablet to 7 mg tablet to see if she would do ok at a 10mg  total dose (if still cost-effective) or consider an alternative regimen altogether. She is to contact PCP office to schedule a follow-up. Message sent to PCP for FYI.  Follow Up Instructions: I discussed the assessment and treatment plan with the patient. The patient was provided an opportunity to ask questions and all were answered. The patient agreed with the plan and demonstrated an understanding of the instructions.  A copy of instructions were sent to the patient via MyChart unless  otherwise noted below.   The patient was advised to call back or seek an in-person evaluation if the symptoms worsen or if the condition fails to improve as anticipated.  Time:  I spent 10 minutes with the patient via telehealth technology discussing the above problems/concerns.    Cheyenne Climes, PA-C

## 2023-07-15 ENCOUNTER — Ambulatory Visit: Payer: BC Managed Care – PPO | Admitting: Family Medicine

## 2023-07-15 ENCOUNTER — Encounter: Payer: Self-pay | Admitting: Family Medicine

## 2023-07-15 VITALS — BP 132/72 | HR 75 | Temp 98.0°F | Ht 61.0 in | Wt 164.0 lb

## 2023-07-15 DIAGNOSIS — D509 Iron deficiency anemia, unspecified: Secondary | ICD-10-CM | POA: Diagnosis not present

## 2023-07-15 DIAGNOSIS — Z7984 Long term (current) use of oral hypoglycemic drugs: Secondary | ICD-10-CM

## 2023-07-15 DIAGNOSIS — K429 Umbilical hernia without obstruction or gangrene: Secondary | ICD-10-CM

## 2023-07-15 DIAGNOSIS — E1165 Type 2 diabetes mellitus with hyperglycemia: Secondary | ICD-10-CM | POA: Diagnosis not present

## 2023-07-15 DIAGNOSIS — Z23 Encounter for immunization: Secondary | ICD-10-CM | POA: Diagnosis not present

## 2023-07-15 MED ORDER — RYBELSUS 3 MG PO TABS
3.0000 mg | ORAL_TABLET | Freq: Every day | ORAL | 1 refills | Status: DC
Start: 2023-07-15 — End: 2023-08-17

## 2023-07-15 NOTE — Assessment & Plan Note (Signed)
Chest patient has not followed recurrent effusions, we will recheck her levels today and refer to an alternative hematology clinic that is closer.

## 2023-07-15 NOTE — Assessment & Plan Note (Signed)
Will go ahead and refer patient to general surgery given worsening of discomfort surrounding local hernia in the setting of chronic constipation on semaglutide.

## 2023-07-15 NOTE — Assessment & Plan Note (Signed)
Will continue patient on semaglutide at 3 mg dose, and 75 mg once, due to intolerance from the higher dose.   Will also continue her on metformin 1000 mg in the morning and 500 mg at night. Will check an A1c today. We need to consider adding on Jardiance if A1c remains elevated.

## 2023-07-15 NOTE — Progress Notes (Signed)
Established patient visit   Patient: Cheyenne Acosta   DOB: 1971/06/24   52 y.o. Female  MRN: [id number] Visit Date: 07/15/2023  Today's healthcare provider: Sherlyn Hay, DO   Chief Complaint  Patient presents with   Diabetes    Patient was last seen for this in June.  She does not check her glucose at home.     Subjective    HPI Iron deficiency: Taking iron? No [facility] [phone] - advised to call in 11/2022   - has not gone to do infusions d/t scheduling conflicts  Diabetes Mellitus Type II, Follow-up  Lab Results  Component Value Date   HGBA1C 7.5 (A) 03/22/2023   HGBA1C 7.2 (H) 11/16/2022   HGBA1C 8.6 (H) 07/11/2022   Wt Readings from Last 3 Encounters:  07/15/23 164 lb (74.4 kg)  03/22/23 172 lb 9.6 oz (78.3 kg)  11/16/22 176 lb 4.8 oz (80 kg)   Last seen for diabetes 4 months ago.  Management since then includes metformin 1000 mg daily and 500 mg in the evening, as well as semaglutide 7 mg p.o. daily.  - didn't have taste or appeitite and had constipation on semaglutide 14 mg.  - Patient also on rosuvastatin 40 mg daily, aspirin 81 mg daily and ramipril 2.5 mg daily  She reports excellent compliance with treatment. She is having side effect on the semaglutide; having to manually evacuate constipation; colace has helped but not resolved the issue.  Symptoms: Yes fatigue (works two jobs) No foot ulcerations  No appetite changes No nausea  No paresthesia of the feet  No polydipsia  No polyuria No visual disturbances   No vomiting     Home blood sugar records:  doesn't check  Episodes of hypoglycemia? No    Current insulin regiment: None Most Recent Eye Exam: Due, will over today Current exercise: walking Current diet habits: vegetarian  Pertinent Labs: Lab Results  Component Value Date   CHOL 235 (H) 11/16/2022   HDL 68 11/16/2022   LDLCALC 158 (H) 11/16/2022   TRIG 56 11/16/2022   CHOLHDL 3.5 11/16/2022   Lab Results   Component Value Date   NA 140 07/11/2022   K 4.7 07/11/2022   CREATININE 0.72 07/11/2022   EGFR 100 07/11/2022   MICRALBCREAT <28 01/26/2022     ---------------------------------------------------------------------------------------------------     Medications: Outpatient Medications Prior to Visit  Medication Sig Note   aspirin 81 MG chewable tablet Chew by mouth daily.    diazepam (VALIUM) 5 MG tablet Take 1 tablet (5 mg total) by mouth every 12 (twelve) hours as needed for muscle spasms.    metFORMIN (GLUCOPHAGE) 1000 MG tablet TAKE 1 TABLET BY MOUTH IN THE MORNING AND ONE HALF TABLET IN THE EVENING    Omega-3 Fatty Acids (FISH OIL) 1200 MG CAPS Take by mouth.    ramipril (ALTACE) 2.5 MG capsule TAKE 1 CAPSULE BY MOUTH EVERY DAY    rosuvastatin (CRESTOR) 40 MG tablet Take 1 tablet (40 mg total) by mouth daily.    [DISCONTINUED] Semaglutide (RYBELSUS) 7 MG TABS Take 1 tablet (7 mg total) by mouth daily. 07/15/2023: severe constipation   No facility-administered medications prior to visit.    Review of Systems As above.      Objective    BP 132/72 (BP Location: Left Arm, Patient Position: Sitting, Cuff Size: Normal)   Pulse 75   Temp 98 F (36.7 C) (Oral)   Ht 5\' 1"  (1.549 m)  Wt 164 lb (74.4 kg)   LMP 07/24/2019   SpO2 100%   BMI 30.99 kg/m     Physical Exam Constitutional:      Appearance: Normal appearance.  HENT:     Head: Normocephalic and atraumatic.  Eyes:     General: No scleral icterus.    Conjunctiva/sclera: Conjunctivae normal.  Cardiovascular:     Pulses:          Dorsalis pedis pulses are 2+ on the right side and 2+ on the left side.       Posterior tibial pulses are 2+ on the right side and 2+ on the left side.  Musculoskeletal:     Right foot: Normal range of motion. No deformity, bunion, Charcot foot, foot drop or prominent metatarsal heads.     Left foot: Normal range of motion. No deformity, bunion, Charcot foot, foot drop or  prominent metatarsal heads.  Feet:     Right foot:     Protective Sensation: 10 sites tested.  10 sites sensed.     Skin integrity: No ulcer, blister, skin breakdown, erythema, warmth, callus, dry skin or fissure.     Toenail Condition: Right toenails are normal.     Left foot:     Protective Sensation: 10 sites tested.  10 sites sensed.     Skin integrity: No ulcer, blister, skin breakdown, erythema, warmth, callus, dry skin or fissure.     Toenail Condition: Left toenails are normal.  Neurological:     Mental Status: She is alert and oriented to person, place, and time. Mental status is at baseline.  Psychiatric:        Mood and Affect: Mood normal.        Behavior: Behavior normal.      No results found for any visits on 07/15/23.  Assessment & Plan    Type 2 diabetes mellitus with hyperglycemia, without long-term current use of insulin (HCC) Assessment & Plan: Will continue patient on semaglutide at 3 mg dose, and 75 mg once, due to intolerance from the higher dose.   Will also continue her on metformin 1000 mg in the morning and 500 mg at night. Will check an A1c today. We need to consider adding on Jardiance if A1c remains elevated.  Orders: -     Microalbumin / creatinine urine ratio -     Comprehensive metabolic panel -     Hemoglobin A1c -     Lipid panel -     Vitamin B12 -     Rybelsus; Take 1 tablet (3 mg total) by mouth daily.  Dispense: 90 tablet; Refill: 1  Umbilical hernia without obstruction and without gangrene Assessment & Plan: Will go ahead and refer patient to general surgery given worsening of discomfort surrounding local hernia in the setting of chronic constipation on semaglutide.  Orders: -     Ambulatory referral to General Surgery  Iron deficiency anemia, unspecified iron deficiency anemia type Assessment & Plan: Chest patient has not followed recurrent effusions, we will recheck her levels today and refer to an alternative hematology clinic  that is closer.  Orders: -     Iron, TIBC and Ferritin Panel -     Ferritin -     CBC -     Ambulatory referral to Hematology / Oncology  Need for influenza vaccination -     Flu vaccine trivalent PF, 6mos and older(Flulaval,Afluria,Fluarix,Fluzone)   Return in about 6 months (around 01/13/2024).  I discussed the assessment and treatment plan with the patient  The patient was provided an opportunity to ask questions and all were answered. The patient agreed with the plan and demonstrated an understanding of the instructions.   The patient was advised to call back or seek an in-person evaluation if the symptoms worsen or if the condition fails to improve as anticipated.    Sherlyn Hay, DO  [facility] [phone] (phone) [phone] (fax)  [facility]

## 2023-07-17 LAB — COMPREHENSIVE METABOLIC PANEL
ALT: 11 [IU]/L (ref 0–32)
AST: 13 [IU]/L (ref 0–40)
Albumin: 4.4 g/dL (ref 3.8–4.9)
Alkaline Phosphatase: 86 [IU]/L (ref 44–121)
BUN/Creatinine Ratio: 11 (ref 9–23)
BUN: 10 mg/dL (ref 6–24)
Bilirubin Total: 0.3 mg/dL (ref 0.0–1.2)
CO2: 22 mmol/L (ref 20–29)
Calcium: 9.5 mg/dL (ref 8.7–10.2)
Chloride: 103 mmol/L (ref 96–106)
Creatinine, Ser: 0.93 mg/dL (ref 0.57–1.00)
Globulin, Total: 3 g/dL (ref 1.5–4.5)
Glucose: 92 mg/dL (ref 70–99)
Potassium: 4.6 mmol/L (ref 3.5–5.2)
Sodium: 140 mmol/L (ref 134–144)
Total Protein: 7.4 g/dL (ref 6.0–8.5)
eGFR: 73 mL/min/{1.73_m2} (ref >59)

## 2023-07-17 LAB — CBC
Hematocrit: 36.2 % (ref 34.0–46.6)
Hemoglobin: 11.2 g/dL (ref 11.1–15.9)
MCH: 25.9 pg — ABNORMAL LOW (ref 26.6–33.0)
MCHC: 30.9 g/dL — ABNORMAL LOW (ref 31.5–35.7)
MCV: 84 fL (ref 79–97)
Platelets: 234 10*3/uL (ref 150–450)
RBC: 4.32 x10E6/uL (ref 3.77–5.28)
RDW: 14.4 % (ref 11.7–15.4)
WBC: 5.2 10*3/uL (ref 3.4–10.8)

## 2023-07-17 LAB — HEMOGLOBIN A1C
Est. average glucose Bld gHb Est-mCnc: 140 mg/dL
Hgb A1c MFr Bld: 6.5 % — ABNORMAL HIGH (ref 4.8–5.6)

## 2023-07-17 LAB — IRON,TIBC AND FERRITIN PANEL
Ferritin: 10 ng/mL — ABNORMAL LOW (ref 15–150)
Iron Saturation: 13 % — ABNORMAL LOW (ref 15–55)
Iron: 46 ug/dL (ref 27–159)
Total Iron Binding Capacity: 354 ug/dL (ref 250–450)
UIBC: 308 ug/dL (ref 131–425)

## 2023-07-17 LAB — MICROALBUMIN / CREATININE URINE RATIO
Creatinine, Urine: 73.6 mg/dL
Microalb/Creat Ratio: 164 mg/g{creat} — ABNORMAL HIGH (ref 0–29)
Microalbumin, Urine: 120.8 ug/mL

## 2023-07-17 LAB — LIPID PANEL
Chol/HDL Ratio: 2.6 {ratio} (ref 0.0–4.4)
Cholesterol, Total: 173 mg/dL (ref 100–199)
HDL: 67 mg/dL (ref >39)
LDL Chol Calc (NIH): 95 mg/dL (ref 0–99)
Triglycerides: 57 mg/dL (ref 0–149)
VLDL Cholesterol Cal: 11 mg/dL (ref 5–40)

## 2023-07-17 LAB — VITAMIN B12: Vitamin B-12: 1385 pg/mL — ABNORMAL HIGH (ref 232–1245)

## 2023-07-18 ENCOUNTER — Encounter: Payer: Self-pay | Admitting: Family Medicine

## 2023-08-04 ENCOUNTER — Other Ambulatory Visit: Payer: Self-pay | Admitting: Physician Assistant

## 2023-08-04 DIAGNOSIS — E119 Type 2 diabetes mellitus without complications: Secondary | ICD-10-CM

## 2023-08-05 ENCOUNTER — Other Ambulatory Visit: Payer: Self-pay | Admitting: Physician Assistant

## 2023-08-05 DIAGNOSIS — I152 Hypertension secondary to endocrine disorders: Secondary | ICD-10-CM

## 2023-08-05 DIAGNOSIS — E1165 Type 2 diabetes mellitus with hyperglycemia: Secondary | ICD-10-CM

## 2023-08-06 ENCOUNTER — Telehealth: Payer: Self-pay | Admitting: Family Medicine

## 2023-08-06 DIAGNOSIS — K5903 Drug induced constipation: Secondary | ICD-10-CM

## 2023-08-06 DIAGNOSIS — E1165 Type 2 diabetes mellitus with hyperglycemia: Secondary | ICD-10-CM

## 2023-08-06 NOTE — Telephone Encounter (Signed)
Received a fax from covermymeds for Rybelsus 14mg   Key: [id number]

## 2023-08-06 NOTE — Telephone Encounter (Signed)
Your PA has been faxed to the plan as a paper copy. Please contact the plan directly if you haven't received a determination in a typical timeframe.  You will be notified of the determination electronically and via fax. Contact plan to follow up on [id number]

## 2023-08-07 NOTE — Telephone Encounter (Signed)
Printed prior authorization form and faxed to the number provided at the bottom of the form.

## 2023-08-08 NOTE — Telephone Encounter (Signed)
Outcome: Denied

## 2023-08-15 NOTE — Telephone Encounter (Signed)
Pt called in asking for info to change for therapeutic level to be at a 7 so she an get the Rybelsus and then add stool softeners with that.

## 2023-08-16 ENCOUNTER — Telehealth: Payer: Self-pay | Admitting: Family Medicine

## 2023-08-16 NOTE — Telephone Encounter (Signed)
Dr. Payton Mccallum there's a denial letter scanned under the media tab.

## 2023-08-16 NOTE — Telephone Encounter (Signed)
Pt is calling in checking on the status of Semaglutide (RYBELSUS) 3 MG TABS [[id number]] , please follow up with pt. Pt says she is off of Rybelsus and it will need to be expedited because she can't be off of it for a certain amount of days due to the withdrawals and she doesn't want to go through that.

## 2023-08-16 NOTE — Telephone Encounter (Signed)
Received fax from cover my meds for Rybelsus 14 mg.   Key:  [id number]

## 2023-08-17 ENCOUNTER — Encounter: Payer: Self-pay | Admitting: Family Medicine

## 2023-08-17 MED ORDER — RYBELSUS 7 MG PO TABS
7.0000 mg | ORAL_TABLET | Freq: Every day | ORAL | 1 refills | Status: DC
Start: 2023-08-17 — End: 2024-07-24

## 2023-08-17 MED ORDER — DOCUSATE SODIUM 100 MG PO CAPS
100.0000 mg | ORAL_CAPSULE | Freq: Two times a day (BID) | ORAL | 1 refills | Status: DC
Start: 2023-08-17 — End: 2024-07-24

## 2023-08-17 NOTE — Addendum Note (Signed)
Addended by: Jacquenette Shone on: 08/17/2023 08:09 AM   Modules accepted: Orders

## 2023-08-19 ENCOUNTER — Telehealth: Payer: Self-pay | Admitting: Family Medicine

## 2023-08-19 NOTE — Telephone Encounter (Signed)
Received fax from Cover my meds for Rybelsus 7 mg.  Key:  [id number]

## 2023-08-20 NOTE — Telephone Encounter (Signed)
Forms received through fax and has been given to provider to complete her part.

## 2023-08-20 NOTE — Telephone Encounter (Signed)
We just received the form for 7 mg?

## 2023-08-20 NOTE — Telephone Encounter (Signed)
Recieved a fax from covermymeds for meds for Rybelsus 7 mg is waiting   [id number]

## 2023-08-26 NOTE — Telephone Encounter (Signed)
Patient called to f/u on the auth for Rybelsus 7 mg. Please f/u with patient

## 2023-08-27 ENCOUNTER — Ambulatory Visit: Payer: Self-pay

## 2023-08-27 NOTE — Telephone Encounter (Signed)
  Chief Complaint: Medication Symptoms: NA Frequency: NA Pertinent Negatives: Patient denies NA Disposition: [] ED /[] Urgent Care (no appt availability in office) / [] Appointment(In office/virtual)/ []  [facility]/ [] Home Care/ [] Refused Recommended Disposition /[] [facility]/ [x]  Follow-up with PCP Additional Notes:   Pt states called [facility], reported they are waiting on PCP to approve med.  Please advise. Reason for Disposition  [1] Caller has NON-URGENT medicine question about med that PCP prescribed AND [2] triager unable to answer question  Answer Assessment - Initial Assessment Questions 1. NAME of MEDICINE: "What medicine(s) are you calling about?"     Semaglutide. 2. QUESTION: "What is your question?" (e.g., double dose of medicine, side effect)     Do not have  Protocols used: Medication Question Call-A-AH

## 2023-08-27 NOTE — Telephone Encounter (Signed)
Patient called, left VM to return the call to the office to speak to the NT.   Summary: medication has not been received and patient is upset and would like to speak with a nurse   Pt Gluckman is upset and  would like to speak with a nurse regarding her medication Semaglutide (RYBELSUS) 7 MG TABS [[id number]],  she has not received

## 2023-08-28 NOTE — Telephone Encounter (Signed)
Spoke with front desk. Forms were sent to be scanned into patients chart after fax went through.  No response received back since then.  Contacted patient to advise. LVMTCB. CRM created. Ok for [facility] to advise

## 2023-08-28 NOTE — Telephone Encounter (Signed)
Spoke with front desk. Forms were sent to be scanned into patients chart after fax went through.  No response received back since then.  Contacted patient to advise. LVMTCB. CRM created. Ok for [facility] to advise

## 2023-09-04 ENCOUNTER — Telehealth: Payer: Self-pay | Admitting: Family Medicine

## 2023-09-04 ENCOUNTER — Telehealth: Payer: Self-pay

## 2023-09-04 NOTE — Telephone Encounter (Signed)
Recieved a fax from covermymeds -PA  has been started for Rybelsus 3MG  Tablets   key: [id number]

## 2023-09-04 NOTE — Telephone Encounter (Signed)
Pt is calling in checking on the status of her Prior Authorization.

## 2023-09-04 NOTE — Telephone Encounter (Signed)
Patient has called with her [facility] [employer] on the line. Per Debarah Crape with [employer] states they are missing additional information for prior authorization for medication Semaglutide (RYBELSUS) 7 MG TABS. They are missing office chart notes that states clinical & criteria & chart notes to as why requesting this medication and what other medications (Mounjaro, Ozempic) she has tried prior to requesting this medication.   Please advise.   Patients callback # [phone]  [facility] # [phone] (Member services)

## 2023-09-04 NOTE — Telephone Encounter (Signed)
Duplicate. Please see other CRM

## 2023-09-04 NOTE — Telephone Encounter (Signed)
Copied from CRM [id number]. Topic: General - Other >> Sep 04, 2023  1:05 PM Everette C wrote: Reason for CRM: The patient has called to request a prior authorization for a therapeutic level Semaglutide (RYBELSUS) 7 MG TABS [[id number]]  The specification of a therapeutic level needs to be stressed   Please contact the patient further if needed

## 2023-09-06 ENCOUNTER — Encounter: Payer: Self-pay | Admitting: Family Medicine

## 2023-09-13 NOTE — Telephone Encounter (Signed)
Called patient's insurance spoke with Candence. Reports the appeal is in progress with [facility]. The turn around is up to 30 days. Called patient and made her aware.

## 2023-09-13 NOTE — Telephone Encounter (Signed)
Pt following up on the request for her   Semaglutide (RYBELSUS) 7 MG TABS to get approved.  Pt states she is not going to call and follow up w/ the insurance co.  She states she wants Dr Payton Mccallum to follow up w/ the insurance and find out why they will not approve.

## 2023-09-30 ENCOUNTER — Telehealth: Payer: Self-pay

## 2023-10-01 ENCOUNTER — Ambulatory Visit: Payer: Self-pay | Admitting: Family Medicine

## 2023-10-01 ENCOUNTER — Encounter: Payer: Self-pay | Admitting: Family Medicine

## 2023-10-01 DIAGNOSIS — E1165 Type 2 diabetes mellitus with hyperglycemia: Secondary | ICD-10-CM

## 2023-10-01 MED ORDER — RYBELSUS 3 MG PO TABS: 3.0000 mg | ORAL_TABLET | Freq: Every day | ORAL | 0 refills | Status: DC

## 2023-10-03 NOTE — Telephone Encounter (Signed)
 Recieved a fax from covermymeds for Rybelsus 2mg  tablets has been started.   JXB:[id number]

## 2023-10-07 NOTE — Telephone Encounter (Signed)
 Copied from CRM [id number]. Topic: Clinical - Prescription Issue >> Oct 04, 2023  3:20 PM Tobias CROME wrote: Reason for CRM: Leita with [employer] calling with appeals department for Rybelsus  prescription. Prescription was changed so Leita unsure prior authorization is needed. Leita requesting clarification  Best callback number: [phone]

## 2023-10-28 ENCOUNTER — Telehealth: Payer: Self-pay | Admitting: Family Medicine

## 2023-10-28 NOTE — Telephone Encounter (Signed)
Covermymeds is requesting prior authorization Key: [id number] Rybelsus 3mg  tablets

## 2023-10-31 NOTE — Telephone Encounter (Signed)
NO PA needed please see telephone encounter from 09/03/2023. Patient started the 7 mg.

## 2023-11-19 ENCOUNTER — Telehealth: Payer: Self-pay

## 2023-11-19 ENCOUNTER — Other Ambulatory Visit (HOSPITAL_COMMUNITY): Payer: Self-pay

## 2023-11-19 NOTE — Telephone Encounter (Signed)
Pharmacy Patient Advocate Encounter  Received notification from  [facility]  that Prior Authorization for Rybelsus 3 mg tablets has been APPROVED from 11/19/23 to 11/18/24   PA #/Case ID/Reference #: [id number]

## 2023-11-19 NOTE — Telephone Encounter (Signed)
Recieved a fax from covermymeds for Rybelsus 3mg  Tablets. A PA has been started   key: [id number]

## 2023-11-19 NOTE — Telephone Encounter (Signed)
Pharmacy Patient Advocate Encounter   Received notification from Pt Calls Messages that prior authorization for Rybelsus 3MG  tablets is required/requested.   Insurance verification completed.   The patient is insured through  [facility]  .   Per test claim: PA required; PA submitted to above mentioned insurance via CoverMyMeds Key/confirmation #/EOC  [id number] Status is pending

## 2024-01-08 ENCOUNTER — Other Ambulatory Visit: Payer: Self-pay | Admitting: Physician Assistant

## 2024-02-06 ENCOUNTER — Telehealth: Payer: Self-pay

## 2024-02-06 DIAGNOSIS — E1165 Type 2 diabetes mellitus with hyperglycemia: Secondary | ICD-10-CM

## 2024-02-06 DIAGNOSIS — D509 Iron deficiency anemia, unspecified: Secondary | ICD-10-CM

## 2024-02-06 DIAGNOSIS — R7989 Other specified abnormal findings of blood chemistry: Secondary | ICD-10-CM

## 2024-02-06 DIAGNOSIS — E785 Hyperlipidemia, unspecified: Secondary | ICD-10-CM

## 2024-02-06 DIAGNOSIS — E1159 Type 2 diabetes mellitus with other circulatory complications: Secondary | ICD-10-CM

## 2024-02-06 NOTE — Telephone Encounter (Signed)
 Copied from CRM [id number]. Topic: Clinical - Request for Lab/Test Order >> Feb 06, 2024  3:19 PM Crispin Dolphin wrote: Reason for CRM: Patient called wanted to have physical and lab work on June 9th. That's the only day her job will let her off. No appts available for physical that day. Patient still request to have blood work. Would like to have her regular tests for kidneys and diabetes and pancreas. No order showing for labs. Added to wait list for physical. Thank You

## 2024-02-12 NOTE — Telephone Encounter (Signed)
 This RN attempted to contact patient pertaining to message regarding labwork and approval to go after fasting for 8 hours. LVM.

## 2024-02-12 NOTE — Telephone Encounter (Signed)
 Lvmtcb. Ok for e2c2 to advise labs have been ordered and advised as below  "Please go to the lab draw station in [address] on the second floor of [facility] when you are fasting for 8 hours. Normal hours are 8:00 am to 11:30 am and 1:00 pm to 4:00 pm Monday through Friday."

## 2024-02-14 NOTE — Telephone Encounter (Signed)
 There are no available slots for 6/9 I will schedule her at the soonest available

## 2024-02-14 NOTE — Telephone Encounter (Signed)
 Called patient to schedule physical but had to leave voicemail and let her know that there are lab orders in for her as well. Ok to schedule her for physical anytime after 6/9 if patient calls back

## 2024-02-24 ENCOUNTER — Encounter: Payer: Self-pay | Admitting: Family Medicine

## 2024-02-24 DIAGNOSIS — N6320 Unspecified lump in the left breast, unspecified quadrant: Secondary | ICD-10-CM

## 2024-02-28 NOTE — Telephone Encounter (Unsigned)
 Copied from CRM [id number]. Topic: Referral - Request for Referral >> Feb 28, 2024  9:16 AM Zipporah Him wrote: Did the patient discuss referral with their provider in the last year? Yes  Appointment offered? Yes  Type of order/referral and detailed reason for visit: For a mammogram  Preference of office, provider, location: [facility], Mammogram once a year  If referral order, have you been seen by this specialty before? Yes Previous exams  Can we respond through MyChart? Yes

## 2024-03-06 ENCOUNTER — Encounter: Payer: Self-pay | Admitting: Family Medicine

## 2024-03-06 ENCOUNTER — Ambulatory Visit: Admitting: Family Medicine

## 2024-03-06 VITALS — BP 115/69 | HR 91 | Resp 14 | Ht 61.0 in | Wt 172.7 lb

## 2024-03-06 DIAGNOSIS — Z7984 Long term (current) use of oral hypoglycemic drugs: Secondary | ICD-10-CM

## 2024-03-06 DIAGNOSIS — E1165 Type 2 diabetes mellitus with hyperglycemia: Secondary | ICD-10-CM

## 2024-03-06 DIAGNOSIS — N6320 Unspecified lump in the left breast, unspecified quadrant: Secondary | ICD-10-CM

## 2024-03-06 DIAGNOSIS — E1159 Type 2 diabetes mellitus with other circulatory complications: Secondary | ICD-10-CM

## 2024-03-06 DIAGNOSIS — G8929 Other chronic pain: Secondary | ICD-10-CM

## 2024-03-06 DIAGNOSIS — E1122 Type 2 diabetes mellitus with diabetic chronic kidney disease: Secondary | ICD-10-CM | POA: Diagnosis not present

## 2024-03-06 DIAGNOSIS — M542 Cervicalgia: Secondary | ICD-10-CM

## 2024-03-06 DIAGNOSIS — K5909 Other constipation: Secondary | ICD-10-CM

## 2024-03-06 DIAGNOSIS — N182 Chronic kidney disease, stage 2 (mild): Secondary | ICD-10-CM

## 2024-03-06 DIAGNOSIS — D509 Iron deficiency anemia, unspecified: Secondary | ICD-10-CM

## 2024-03-06 DIAGNOSIS — E785 Hyperlipidemia, unspecified: Secondary | ICD-10-CM

## 2024-03-06 DIAGNOSIS — E1169 Type 2 diabetes mellitus with other specified complication: Secondary | ICD-10-CM | POA: Diagnosis not present

## 2024-03-06 DIAGNOSIS — R7989 Other specified abnormal findings of blood chemistry: Secondary | ICD-10-CM

## 2024-03-06 MED ORDER — DIAZEPAM 5 MG PO TABS: 5.0000 mg | ORAL_TABLET | Freq: Two times a day (BID) | ORAL | 0 refills | Status: AC | PRN

## 2024-03-06 NOTE — Patient Instructions (Signed)
 For iron infusion, please contact and schedule with:              [facility]              [address]              [place], [place]  [address]              Phone:  [phone]   Fax:  [phone]

## 2024-03-06 NOTE — Progress Notes (Signed)
 Established patient visit   Patient: Cheyenne Acosta   DOB: [birth year]   53 y.o. Female  MRN: [id number] Visit Date: 03/06/2024  Today's healthcare provider: Carlean Charter, DO   Chief Complaint  Patient presents with   Breast Problem    L breast bump   Constipation   Subjective    HPI:  Cheyenne Acosta is a 53 year old female who presents with a lump in her left breast and for routine blood work.  She has a tender and painful lump in her left breast, particularly noticeable when leaning forward or during self-examination. She has a history of nodules in the same breast, which have been stable with monitoring every three months until now. She feels the lump may be changing and is concerned about it.  She is here for routine blood work to monitor her kidney function, as previously discussed to be checked every three months. She has a history of diabetes and is not currently monitoring her blood sugar levels but is taking her medication regularly. Her last cholesterol test was normal.  She experiences constipation, which she attributes to her medication, Rybelsus . She manages this with castor oil and Colace, taken as needed. Constipation is a common issue in her family.  She has difficulty sleeping, often only getting about three hours of sleep per night.  She has a history of fibroids and underwent a procedure to remove them, resulting in the cessation of her menstrual periods about one to two years ago. She has not experienced fatigue or other symptoms related to low iron, despite not receiving an iron infusion due to scheduling conflicts.  She is not interested in receiving the COVID booster, pneumonia, or shingles vaccines. She has a history of high B12 levels, which she attributes to her diet, including juicing and consuming plant-based foods.  She takes diazepam  for neck pain as needed, which lasts her a long time due to infrequent use. No numbness, tingling,  palpitations, or fatigue.       Medications: Outpatient Medications Prior to Visit  Medication Sig   aspirin 81 MG chewable tablet Chew by mouth daily.   docusate sodium  (COLACE) 100 MG capsule Take 1 capsule (100 mg total) by mouth 2 (two) times daily.   metFORMIN  (GLUCOPHAGE ) 1000 MG tablet TAKE 1 TABLET BY MOUTH IN THE MORNING AND ONE HALF TABLET IN THE EVENING   Omega-3 Fatty Acids (FISH OIL) 1200 MG CAPS Take by mouth.   ramipril  (ALTACE ) 2.5 MG capsule TAKE 1 CAPSULE BY MOUTH EVERY DAY   rosuvastatin  (CRESTOR ) 40 MG tablet TAKE 1 TABLET BY MOUTH EVERY DAY   Semaglutide  (RYBELSUS ) 7 MG TABS Take 1 tablet (7 mg total) by mouth daily.   [DISCONTINUED] diazepam  (VALIUM ) 5 MG tablet Take 1 tablet (5 mg total) by mouth every 12 (twelve) hours as needed for muscle spasms.   [DISCONTINUED] Semaglutide  (RYBELSUS ) 3 MG TABS Take 1 tablet (3 mg total) by mouth daily. To be taken prior to restarting the Rybelsus  7 mg tablets   No facility-administered medications prior to visit.        Objective    BP 115/69 (BP Location: Left Arm, Patient Position: Sitting, Cuff Size: Large)   Pulse 91   Resp 14   Ht 5' 1 (1.549 m)   Wt 172 lb 11.2 oz (78.3 kg)   LMP 07/24/2019   SpO2 100%   BMI 32.63 kg/m     Physical Exam Vitals and nursing note reviewed.  Constitutional:      General: She is not in acute distress.    Appearance: Normal appearance.  HENT:     Head: Normocephalic and atraumatic.   Eyes:     General: No scleral icterus.    Conjunctiva/sclera: Conjunctivae normal.    Cardiovascular:     Rate and Rhythm: Normal rate.  Pulmonary:     Effort: Pulmonary effort is normal.   Neurological:     Mental Status: She is alert and oriented to person, place, and time. Mental status is at baseline.   Psychiatric:        Mood and Affect: Mood normal.        Behavior: Behavior normal.      Results for orders placed or performed in visit on 03/06/24  Comprehensive  metabolic panel with GFR  Result Value Ref Range   Glucose 162 (H) 70 - 99 mg/dL   BUN 8 6 - 24 mg/dL   Creatinine, Ser 1.47 0.57 - 1.00 mg/dL   eGFR 75 >82 NF/AOZ/3.08   BUN/Creatinine Ratio 9 9 - 23   Sodium 140 134 - 144 mmol/L   Potassium 4.5 3.5 - 5.2 mmol/L   Chloride 101 96 - 106 mmol/L   CO2 23 20 - 29 mmol/L   Calcium  9.5 8.7 - 10.2 mg/dL   Total Protein 7.6 6.0 - 8.5 g/dL   Albumin 4.4 3.8 - 4.9 g/dL   Globulin, Total 3.2 1.5 - 4.5 g/dL   Bilirubin Total 0.3 0.0 - 1.2 mg/dL   Alkaline Phosphatase 96 44 - 121 IU/L   AST 14 0 - 40 IU/L   ALT 12 0 - 32 IU/L  Hemoglobin A1c  Result Value Ref Range   Hgb A1c MFr Bld 8.5 (H) 4.8 - 5.6 %   Est. average glucose Bld gHb Est-mCnc 197 mg/dL  Lipid panel  Result Value Ref Range   Cholesterol, Total 168 100 - 199 mg/dL   Triglycerides 51 0 - 149 mg/dL   HDL 71 >65 mg/dL   VLDL Cholesterol Cal 10 5 - 40 mg/dL   LDL Chol Calc (NIH) 87 0 - 99 mg/dL   Chol/HDL Ratio 2.4 0.0 - 4.4 ratio  Vitamin B12  Result Value Ref Range   Vitamin B-12 1,521 (H) 232 - 1,245 pg/mL  Iron, TIBC and Ferritin Panel  Result Value Ref Range   Total Iron Binding Capacity 381 250 - 450 ug/dL   UIBC 784 696 - 295 ug/dL   Iron 54 27 - 284 ug/dL   Iron Saturation 14 (L) 15 - 55 %   Ferritin 15 15 - 150 ng/mL    Assessment & Plan    Type 2 diabetes mellitus with stage 2 chronic kidney disease, without long-term current use of insulin (HCC) -     Comprehensive metabolic panel with GFR -     Hemoglobin A1c  Chronic constipation  Hyperlipidemia associated with type 2 diabetes mellitus (HCC) -     Lipid panel  Iron deficiency anemia, unspecified iron deficiency anemia type -     Iron, TIBC and Ferritin Panel  Mass of left breast, unspecified quadrant -     MM Digital Diagnostic Unilat L; Future -     US  BREAST COMPLETE UNI LEFT INC AXILLA; Future  Chronic neck pain -     diazePAM ; Take 1 tablet (5 mg total) by mouth every 12 (twelve) hours as  needed for muscle spasms.  Dispense: 15 tablet; Refill: 0  Elevated vitamin  B12 level -     Vitamin B12     Mass of left breast Reports changes in left breast lump with tenderness. Previous imaging showed nodules, no biopsy done. - Order diagnostic mammogram for the left breast. - Refer to OBGYN clinic at [facility] for further evaluation.  Type 2 Diabetes Mellitus; chronic constipation Managed with Rybelsus . Reports constipation, worsened with Rybelsus .  Prefers not to switch to injectable medication. Discussed constipation management. - Continue Rybelsus . - Advise on managing constipation with Colace as needed and recommend against castor oil use.  Chronic Kidney Disease (CKD) stage 2 Slightly reduced kidney function, possibly early CKD. Emphasized controlling blood pressure, blood sugar, and cholesterol. - Order blood work to monitor kidney function.  Iron Deficiency Anemia Low iron levels, no fatigue. Unable to schedule iron infusion due to work. Discussed importance of scheduling infusion. - Check iron panel. - Provide contact information for infusion center to facilitate scheduling.  General Health Maintenance Up to date with eye exams. Declined COVID, pneumonia, and shingles vaccines. Discussed risks of shingles and pneumonia. - Request eye exam records to be sent to the clinic.    Follow-up Discussed importance of regular follow-up for monitoring and treatment adjustment. - Schedule follow-up appointment in 1-3 months. - Review lab results once available.  Return in about 3 months (around 06/06/2024) for Chronic f/u.      I discussed the assessment and treatment plan with the patient  The patient was provided an opportunity to ask questions and all were answered. The patient agreed with the plan and demonstrated an understanding of the instructions.   The patient was advised to call back or seek an in-person evaluation if the symptoms worsen or if the condition fails to  improve as anticipated.    Carlean Charter, DO  [facility] [phone] (phone) [phone] (fax)  [facility]

## 2024-03-07 LAB — IRON,TIBC AND FERRITIN PANEL
Ferritin: 15 ng/mL (ref 15–150)
Iron Saturation: 14 % — ABNORMAL LOW (ref 15–55)
Iron: 54 ug/dL (ref 27–159)
Total Iron Binding Capacity: 381 ug/dL (ref 250–450)
UIBC: 327 ug/dL (ref 131–425)

## 2024-03-07 LAB — COMPREHENSIVE METABOLIC PANEL WITH GFR
ALT: 12 IU/L (ref 0–32)
AST: 14 IU/L (ref 0–40)
Albumin: 4.4 g/dL (ref 3.8–4.9)
Alkaline Phosphatase: 96 IU/L (ref 44–121)
BUN/Creatinine Ratio: 9 (ref 9–23)
BUN: 8 mg/dL (ref 6–24)
Bilirubin Total: 0.3 mg/dL (ref 0.0–1.2)
CO2: 23 mmol/L (ref 20–29)
Calcium: 9.5 mg/dL (ref 8.7–10.2)
Chloride: 101 mmol/L (ref 96–106)
Creatinine, Ser: 0.9 mg/dL (ref 0.57–1.00)
Globulin, Total: 3.2 g/dL (ref 1.5–4.5)
Glucose: 162 mg/dL — ABNORMAL HIGH (ref 70–99)
Potassium: 4.5 mmol/L (ref 3.5–5.2)
Sodium: 140 mmol/L (ref 134–144)
Total Protein: 7.6 g/dL (ref 6.0–8.5)
eGFR: 75 mL/min/{1.73_m2} (ref >59)

## 2024-03-07 LAB — LIPID PANEL
Chol/HDL Ratio: 2.4 ratio (ref 0.0–4.4)
Cholesterol, Total: 168 mg/dL (ref 100–199)
HDL: 71 mg/dL (ref >39)
LDL Chol Calc (NIH): 87 mg/dL (ref 0–99)
Triglycerides: 51 mg/dL (ref 0–149)
VLDL Cholesterol Cal: 10 mg/dL (ref 5–40)

## 2024-03-07 LAB — HEMOGLOBIN A1C
Est. average glucose Bld gHb Est-mCnc: 197 mg/dL
Hgb A1c MFr Bld: 8.5 % — ABNORMAL HIGH (ref 4.8–5.6)

## 2024-03-07 LAB — VITAMIN B12: Vitamin B-12: 1521 pg/mL — ABNORMAL HIGH (ref 232–1245)

## 2024-03-12 ENCOUNTER — Ambulatory Visit: Payer: Self-pay | Admitting: Family Medicine

## 2024-03-12 NOTE — Telephone Encounter (Signed)
 Responded to desire to stop rybelsus  in separate results message.

## 2024-03-17 ENCOUNTER — Encounter: Payer: Self-pay | Admitting: Family Medicine

## 2024-03-23 ENCOUNTER — Encounter: Admitting: Family Medicine

## 2024-04-10 LAB — HM MAMMOGRAPHY

## 2024-04-16 ENCOUNTER — Ambulatory Visit: Payer: Self-pay | Admitting: Family Medicine

## 2024-06-12 ENCOUNTER — Ambulatory Visit: Admitting: Family Medicine

## 2024-07-24 ENCOUNTER — Encounter: Payer: Self-pay | Admitting: Family Medicine

## 2024-07-24 ENCOUNTER — Ambulatory Visit (INDEPENDENT_AMBULATORY_CARE_PROVIDER_SITE_OTHER): Admitting: Family Medicine

## 2024-07-24 VITALS — BP 146/79 | HR 98 | Temp 98.7°F | Ht 61.0 in | Wt 184.1 lb

## 2024-07-24 DIAGNOSIS — E1159 Type 2 diabetes mellitus with other circulatory complications: Secondary | ICD-10-CM

## 2024-07-24 DIAGNOSIS — D509 Iron deficiency anemia, unspecified: Secondary | ICD-10-CM

## 2024-07-24 DIAGNOSIS — E1165 Type 2 diabetes mellitus with hyperglycemia: Secondary | ICD-10-CM

## 2024-07-24 DIAGNOSIS — R7989 Other specified abnormal findings of blood chemistry: Secondary | ICD-10-CM | POA: Diagnosis not present

## 2024-07-24 DIAGNOSIS — I152 Hypertension secondary to endocrine disorders: Secondary | ICD-10-CM

## 2024-07-24 DIAGNOSIS — E66811 Obesity, class 1: Secondary | ICD-10-CM

## 2024-07-24 DIAGNOSIS — Z23 Encounter for immunization: Secondary | ICD-10-CM

## 2024-07-24 DIAGNOSIS — Z7984 Long term (current) use of oral hypoglycemic drugs: Secondary | ICD-10-CM

## 2024-07-24 MED ORDER — SITAGLIPTIN PHOSPHATE 100 MG PO TABS: 100.0000 mg | ORAL_TABLET | Freq: Every day | ORAL | 1 refills | Status: DC

## 2024-07-24 MED ORDER — RAMIPRIL 5 MG PO CAPS: 5.0000 mg | ORAL_CAPSULE | Freq: Every day | ORAL | 3 refills | Status: AC

## 2024-07-24 NOTE — Progress Notes (Signed)
 Established patient visit   Patient: Cheyenne Acosta   DOB: Aug 08, 1971   53 y.o. Female  MRN: [id number] Visit Date: 07/24/2024  Today's healthcare provider: LAURAINE LOISE BUOY, DO   Chief Complaint  Patient presents with   Diabetes    Patient is here for a follow up on diabetes, reports she does not monitor her glucose at home.    Diabetic Eye Exam- [facility] will send letter request for information  Flu vaccine- yes  Other vaccines declined   Subjective    HPI Cheyenne Acosta is a 53 year old female with diabetes who presents with constipation and medication side effects.  She experiences significant constipation, which she attributes to the medication Rybelsus . There is no urge to have a bowel movement, and she can go for weeks without one unless she takes an herbal tea remedy. Various over-the-counter remedies such as Cholestin, Metamucil, and Dulcolax have been ineffective. She currently takes the herbal tea every three days to facilitate bowel movements, resulting in multiple bowel movements on the day she takes it.  She has been taking ramipril  2.5 mg but was previously on 5 mg. She attributes some of her stress and potential blood pressure elevation to the recent sudden death of her brother, which has been emotionally challenging for her and her family.  She is currently taking metformin , 1000 mg in the morning and 500 mg in the evening, for diabetes management. She wants to discontinue metformin  due to concerns about long-term use and potential side effects, including high cholesterol. She mentions that her family members have experienced similar issues with medications like Ozempic and Mounjaro, leading them to stop those treatments.  She has a history of high B12 levels, which had never been high until recently. She is not currently taking any B12 supplements. She drinks approximately 66 ounces of water daily, depending on her work schedule.  She has received the  hepatitis B vaccine series at work and is planning to receive a flu shot due to workplace requirements. She has declined the COVID booster and pneumonia vaccine, citing personal experiences and research concerns.     [facility] [facility] - pap smear scheduled next month.     Medications: Outpatient Medications Prior to Visit  Medication Sig   aspirin 81 MG chewable tablet Chew by mouth daily.   diazepam  (VALIUM ) 5 MG tablet Take 1 tablet (5 mg total) by mouth every 12 (twelve) hours as needed for muscle spasms.   metFORMIN  (GLUCOPHAGE ) 1000 MG tablet TAKE 1 TABLET BY MOUTH IN THE MORNING AND ONE HALF TABLET IN THE EVENING   rosuvastatin  (CRESTOR ) 40 MG tablet TAKE 1 TABLET BY MOUTH EVERY DAY   [DISCONTINUED] ramipril  (ALTACE ) 2.5 MG capsule TAKE 1 CAPSULE BY MOUTH EVERY DAY   [DISCONTINUED] docusate sodium  (COLACE) 100 MG capsule Take 1 capsule (100 mg total) by mouth 2 (two) times daily.   [DISCONTINUED] Omega-3 Fatty Acids (FISH OIL) 1200 MG CAPS Take by mouth.   [DISCONTINUED] Semaglutide  (RYBELSUS ) 7 MG TABS Take 1 tablet (7 mg total) by mouth daily.   No facility-administered medications prior to visit.        Objective    BP (!) 146/79 (BP Location: Right Arm, Patient Position: Sitting, Cuff Size: Normal)   Pulse 98   Temp 98.7 F (37.1 C) (Oral)   Ht 5' 1 (1.549 m)   Wt 184 lb 1.6 oz (83.5 kg)   LMP 07/24/2019   SpO2 99%   BMI  34.79 kg/m     Physical Exam Vitals and nursing note reviewed.  Constitutional:      General: She is not in acute distress.    Appearance: Normal appearance.  HENT:     Head: Normocephalic and atraumatic.  Eyes:     General: No scleral icterus.    Conjunctiva/sclera: Conjunctivae normal.  Cardiovascular:     Rate and Rhythm: Normal rate.  Pulmonary:     Effort: Pulmonary effort is normal.  Neurological:     Mental Status: She is alert and oriented to person, place, and time. Mental status is at baseline.  Psychiatric:         Mood and Affect: Mood normal.        Behavior: Behavior normal.      Results for orders placed or performed in visit on 07/24/24  Hemoglobin A1c  Result Value Ref Range   Hgb A1c MFr Bld 10.9 (H) 4.8 - 5.6 %   Est. average glucose Bld gHb Est-mCnc 266 mg/dL  Vitamin A87  Result Value Ref Range   Vitamin B-12 1,420 (H) 232 - 1,245 pg/mL  CBC  Result Value Ref Range   WBC 6.2 3.4 - 10.8 x10E3/uL   RBC 4.73 3.77 - 5.28 x10E6/uL   Hemoglobin 12.7 11.1 - 15.9 g/dL   Hematocrit 59.8 65.9 - 46.6 %   MCV 85 79 - 97 fL   MCH 26.8 26.6 - 33.0 pg   MCHC 31.7 31.5 - 35.7 g/dL   RDW 86.7 88.2 - 84.5 %   Platelets 229 150 - 450 x10E3/uL  Microalbumin / creatinine urine ratio  Result Value Ref Range   Creatinine, Urine 114.9 Not Estab. mg/dL   Microalbumin, Urine 697.4 Not Estab. ug/mL   Microalb/Creat Ratio 263 (H) 0 - 29 mg/g creat    Assessment & Plan    Type 2 diabetes mellitus with hyperglycemia, without long-term current use of insulin (HCC) -     Hemoglobin A1c -     Microalbumin / creatinine urine ratio -     Amb Ref to Medical Weight Management -     SITagliptin Phosphate; Take 1 tablet (100 mg total) by mouth daily.  Dispense: 90 tablet; Refill: 1  Hypertension associated with diabetes (HCC) Assessment & Plan: Chronic, BP elevated today. Will increase ramipril  to 5 mg daily. - Patient to monitor blood pressure at home.  Orders: -     Ramipril ; Take 1 capsule (5 mg total) by mouth daily.  Dispense: 90 capsule; Refill: 3  Microcytic anemia -     CBC  Elevated vitamin B12 level -     Vitamin B12  Obesity (BMI 30.0-34.9) -     Amb Ref to Medical Weight Management  Needs flu shot -     Flu vaccine trivalent PF, 6mos and older(Flulaval,Afluria,Fluarix,Fluzone)     Type 2 diabetes mellitus with hyperglycemia, without long-term current use of insulin; obesity (BMI 30.0-34.9)  Long-standing type II diabetes. Desires to discontinue metformin  due to concern regarding  side effects. Interested in gastric sleeve surgery for diabetes management. - Discontinue Rybelsus  due to constipation as noted below. - Discontinue metformin  upon initiation of Januvia. - Prescribe Januvia 100 mg daily. - Discussed alternative medications including Januvia, Jardiance, and Farxiga. Addressed concerns about Doreen due to family kidney history. Explained potential side effects of Januvia. - Refer to medical weight management for gastric sleeve surgery evaluation.  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 Requires herbal tea every three days for  bowel movements. - Discussed Rybelsus -related constipation. - Discontinue Rybelsus  based on patient preference  General Health Maintenance Discussed flu vaccination requirement for employment. Declined COVID booster and pneumonia vaccine. Confirmed hepatitis B vaccination. - Administer flu vaccine.    Return in about 3 months (around 10/24/2024) for DM, Chronic f/u.      I discussed the assessment and treatment plan with the patient  The patient was provided an opportunity to ask questions and all were answered. The patient agreed with the plan and demonstrated an understanding of the instructions.   The patient was advised to call back or seek an in-person evaluation if the symptoms worsen or if the condition fails to improve as anticipated.    LAURAINE LOISE BUOY, DO  [facility] [phone] (phone) [phone] (fax)  [facility]

## 2024-07-24 NOTE — Assessment & Plan Note (Addendum)
 Chronic, BP elevated today. Will increase ramipril  to 5 mg daily. - Patient to monitor blood pressure at home.

## 2024-07-25 LAB — CBC
Hematocrit: 40.1 % (ref 34.0–46.6)
Hemoglobin: 12.7 g/dL (ref 11.1–15.9)
MCH: 26.8 pg (ref 26.6–33.0)
MCHC: 31.7 g/dL (ref 31.5–35.7)
MCV: 85 fL (ref 79–97)
Platelets: 229 x10E3/uL (ref 150–450)
RBC: 4.73 x10E6/uL (ref 3.77–5.28)
RDW: 13.2 % (ref 11.7–15.4)
WBC: 6.2 x10E3/uL (ref 3.4–10.8)

## 2024-07-25 LAB — VITAMIN B12: Vitamin B-12: 1420 pg/mL — ABNORMAL HIGH (ref 232–1245)

## 2024-07-25 LAB — MICROALBUMIN / CREATININE URINE RATIO
Creatinine, Urine: 114.9 mg/dL
Microalb/Creat Ratio: 263 mg/g{creat} — ABNORMAL HIGH (ref 0–29)
Microalbumin, Urine: 302.5 ug/mL

## 2024-07-25 LAB — HEMOGLOBIN A1C
Est. average glucose Bld gHb Est-mCnc: 266 mg/dL
Hgb A1c MFr Bld: 10.9 % — ABNORMAL HIGH (ref 4.8–5.6)

## 2024-08-03 ENCOUNTER — Encounter: Payer: Self-pay | Admitting: Family Medicine

## 2024-08-07 ENCOUNTER — Ambulatory Visit: Payer: Self-pay | Admitting: Family Medicine

## 2024-08-07 DIAGNOSIS — E1165 Type 2 diabetes mellitus with hyperglycemia: Secondary | ICD-10-CM

## 2024-08-12 NOTE — Progress Notes (Signed)
 LVM to call office back, if patient calls back okay for E2C2 to relay the results to the patient.

## 2024-08-25 NOTE — Telephone Encounter (Signed)
 Copied from CRM [id number]. Topic: Clinical - Prescription Issue >> Aug 25, 2024  2:09 PM Thliyah D wrote: sitaGLIPtin  (JANUVIA ) 100 MG tablet- pt says she need her pcp to talk with her insurance regarding the price of her prescription she says its $150 she can't afford that.

## 2024-08-26 MED ORDER — SAXAGLIPTIN HCL 5 MG PO TABS: 5.0000 mg | ORAL_TABLET | Freq: Every day | ORAL | 3 refills | Status: AC

## 2024-09-15 ENCOUNTER — Telehealth: Payer: Self-pay

## 2024-09-15 DIAGNOSIS — E1129 Type 2 diabetes mellitus with other diabetic kidney complication: Secondary | ICD-10-CM

## 2024-09-15 NOTE — Telephone Encounter (Signed)
 Please advise.      Copied from CRM #[id number]. Topic: Referral - Request for Referral >> Sep 15, 2024 10:51 AM Olam RAMAN wrote: Did the patient discuss referral with their provider in the last year? No (If No - schedule appointment) (If Yes - send message)  Appointment offered? No  Type of order/referral and detailed reason for visit: dietician or nutritionist  Preference of office, provider, location: [place]   If referral order, have you been seen by this specialty before? No (If Yes, this issue or another issue? When? Where?  Can we respond through MyChart? Yes

## 2024-09-15 NOTE — Telephone Encounter (Signed)
 Please advise.  Copied from CRM #[id number]. Topic: Clinical - Medication Question >> Sep 15, 2024 10:46 AM Cheyenne Acosta wrote: Reason for CRM:  She wants to go back on metFORMIN  (GLUCOPHAGE ) 1000 MG tablet and do away with the other medications for her diabetes because they are too high. Please call her [phone]

## 2024-09-25 MED ORDER — METFORMIN HCL ER 500 MG PO TB24: ORAL_TABLET | ORAL | 3 refills | Status: AC

## 2024-10-04 ENCOUNTER — Encounter: Payer: Self-pay | Admitting: Family Medicine

## 2024-10-06 ENCOUNTER — Other Ambulatory Visit: Payer: Self-pay

## 2024-11-27 ENCOUNTER — Ambulatory Visit: Admitting: Family Medicine
